# Patient Record
Sex: Female | Born: 1985 | Race: White | Hispanic: No | Marital: Married | Smoking: Never smoker
Health system: Southern US, Community
[De-identification: ages and names within clinical notes are randomized; demographics above are authoritative.]

## PROBLEM LIST (undated history)

## (undated) DIAGNOSIS — F419 Anxiety disorder, unspecified: Secondary | ICD-10-CM

## (undated) HISTORY — DX: Anxiety disorder, unspecified: F41.9

---

## 2009-01-06 ENCOUNTER — Emergency Department (HOSPITAL_COMMUNITY): Admission: EM | Admit: 2009-01-06 | Discharge: 2009-01-06 | Payer: Self-pay | Admitting: Emergency Medicine

## 2009-06-02 ENCOUNTER — Emergency Department (HOSPITAL_COMMUNITY): Admission: EM | Admit: 2009-06-02 | Discharge: 2009-06-02 | Payer: Self-pay | Admitting: Emergency Medicine

## 2010-07-07 ENCOUNTER — Encounter
Admission: RE | Admit: 2010-07-07 | Discharge: 2010-07-07 | Payer: Self-pay | Source: Home / Self Care | Attending: Family Medicine | Admitting: Family Medicine

## 2010-09-27 LAB — URINALYSIS, ROUTINE W REFLEX MICROSCOPIC
Bilirubin Urine: NEGATIVE
Nitrite: NEGATIVE
Specific Gravity, Urine: 1.005 (ref 1.005–1.030)
pH: 6 (ref 5.0–8.0)

## 2010-09-27 LAB — POCT I-STAT, CHEM 8
Creatinine, Ser: 0.7 mg/dL (ref 0.4–1.2)
HCT: 46 % (ref 36.0–46.0)
Hemoglobin: 15.6 g/dL — ABNORMAL HIGH (ref 12.0–15.0)
Potassium: 3.3 mEq/L — ABNORMAL LOW (ref 3.5–5.1)
Sodium: 137 mEq/L (ref 135–145)

## 2010-12-10 ENCOUNTER — Emergency Department (HOSPITAL_COMMUNITY)
Admission: EM | Admit: 2010-12-10 | Discharge: 2010-12-11 | Disposition: A | Discharge status: Home / Self Care | Payer: BC Managed Care – PPO | Attending: Emergency Medicine | Admitting: Emergency Medicine

## 2010-12-10 ENCOUNTER — Emergency Department (HOSPITAL_COMMUNITY): Payer: BC Managed Care – PPO

## 2010-12-10 DIAGNOSIS — Z0000 Encounter for general adult medical examination without abnormal findings: Secondary | ICD-10-CM | POA: Insufficient documentation

## 2010-12-10 DIAGNOSIS — R209 Unspecified disturbances of skin sensation: Secondary | ICD-10-CM | POA: Insufficient documentation

## 2010-12-10 DIAGNOSIS — R42 Dizziness and giddiness: Secondary | ICD-10-CM | POA: Insufficient documentation

## 2010-12-10 LAB — DIFFERENTIAL
Basophils Absolute: 0 10*3/uL (ref 0.0–0.1)
Lymphocytes Relative: 41 % (ref 12–46)
Neutro Abs: 2.9 10*3/uL (ref 1.7–7.7)
Neutrophils Relative %: 52 % (ref 43–77)

## 2010-12-10 LAB — CBC
HCT: 39.1 % (ref 36.0–46.0)
Hemoglobin: 13.3 g/dL (ref 12.0–15.0)
WBC: 5.7 10*3/uL (ref 4.0–10.5)

## 2010-12-10 LAB — POCT I-STAT, CHEM 8
Chloride: 106 mEq/L (ref 96–112)
HCT: 42 % (ref 36.0–46.0)
Potassium: 4.3 mEq/L (ref 3.5–5.1)

## 2010-12-10 LAB — POCT PREGNANCY, URINE: Preg Test, Ur: NEGATIVE

## 2010-12-11 ENCOUNTER — Encounter (HOSPITAL_COMMUNITY): Payer: Self-pay

## 2011-07-13 ENCOUNTER — Encounter (INDEPENDENT_AMBULATORY_CARE_PROVIDER_SITE_OTHER): Payer: BC Managed Care – PPO

## 2011-07-13 DIAGNOSIS — R079 Chest pain, unspecified: Secondary | ICD-10-CM

## 2011-07-13 DIAGNOSIS — K250 Acute gastric ulcer with hemorrhage: Secondary | ICD-10-CM

## 2011-07-13 DIAGNOSIS — R002 Palpitations: Secondary | ICD-10-CM

## 2012-03-17 ENCOUNTER — Encounter: Payer: Self-pay | Admitting: Family Medicine

## 2012-03-17 ENCOUNTER — Ambulatory Visit (INDEPENDENT_AMBULATORY_CARE_PROVIDER_SITE_OTHER): Payer: BC Managed Care – PPO | Admitting: Family Medicine

## 2012-03-17 VITALS — BP 98/58 | HR 69 | Temp 98.6°F | Resp 16 | Ht 62.0 in | Wt 97.6 lb

## 2012-03-17 DIAGNOSIS — M79609 Pain in unspecified limb: Secondary | ICD-10-CM

## 2012-03-17 DIAGNOSIS — M79673 Pain in unspecified foot: Secondary | ICD-10-CM

## 2012-03-17 DIAGNOSIS — D239 Other benign neoplasm of skin, unspecified: Secondary | ICD-10-CM

## 2012-03-17 DIAGNOSIS — D229 Melanocytic nevi, unspecified: Secondary | ICD-10-CM

## 2012-03-17 NOTE — Patient Instructions (Signed)
Arnica gel- you can apply this to your foot around the painful joint 2-3 times a day.

## 2012-03-19 ENCOUNTER — Encounter: Payer: Self-pay | Admitting: Family Medicine

## 2012-03-19 DIAGNOSIS — Z87898 Personal history of other specified conditions: Secondary | ICD-10-CM | POA: Insufficient documentation

## 2012-03-19 DIAGNOSIS — Z8669 Personal history of other diseases of the nervous system and sense organs: Secondary | ICD-10-CM | POA: Insufficient documentation

## 2012-03-19 DIAGNOSIS — Z0000 Encounter for general adult medical examination without abnormal findings: Secondary | ICD-10-CM | POA: Insufficient documentation

## 2012-03-19 DIAGNOSIS — Z8659 Personal history of other mental and behavioral disorders: Secondary | ICD-10-CM | POA: Insufficient documentation

## 2012-03-19 NOTE — Progress Notes (Signed)
S: This 26 y.o. Cauc female c/o R foot/ arch and big toe pain which has been chronic and intermittent. No hx of trauma to back or legs. Wears flats almost daily. Job involves mostly sitting. She also has a skin lesion on back which is of concern; friend advised that she have it checked.  ROS: noncontributory  O:  Filed Vitals:   03/17/12 1118  BP: 98/58  Pulse: 69  Temp: 98.6 F (37 C)  Resp: 16   GEN: In NAD;WD,WD. BACK: Spine straight w/ deformity or muscle spasms. Tender in right SI notch. Forward flexion to 90 degrees w/ difficulty. EXT: FROM w/o deformities; good muscle tone; R foot- tender distal arch and 1st metatarsal. Medial aspect of ball of foot tender. SKIN: <1 cm slightly raised flesh-colored lesion on lower back with color change at left margin. NEURO: A&O x 3; CNs intact. Motor/ sensory grossly normal.  A/P: 1. Foot pain  Ambulatory referral to Podiatry Advised wearing more supportive foot wear (not flats everyday)  2. Mole of skin  Ambulatory referral to Dermatology

## 2012-04-21 ENCOUNTER — Other Ambulatory Visit: Payer: Self-pay | Admitting: Family Medicine

## 2012-04-21 DIAGNOSIS — G8929 Other chronic pain: Secondary | ICD-10-CM | POA: Insufficient documentation

## 2012-08-08 ENCOUNTER — Encounter: Payer: Self-pay | Admitting: Internal Medicine

## 2014-02-05 ENCOUNTER — Ambulatory Visit (INDEPENDENT_AMBULATORY_CARE_PROVIDER_SITE_OTHER): Payer: BC Managed Care – PPO | Admitting: Family Medicine

## 2014-02-05 ENCOUNTER — Ambulatory Visit (INDEPENDENT_AMBULATORY_CARE_PROVIDER_SITE_OTHER): Payer: BC Managed Care – PPO

## 2014-02-05 VITALS — BP 102/64 | HR 60 | Temp 97.6°F | Resp 18 | Ht 62.5 in | Wt 100.0 lb

## 2014-02-05 DIAGNOSIS — S161XXA Strain of muscle, fascia and tendon at neck level, initial encounter: Secondary | ICD-10-CM

## 2014-02-05 DIAGNOSIS — S139XXA Sprain of joints and ligaments of unspecified parts of neck, initial encounter: Secondary | ICD-10-CM
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
## 2015-02-13 ENCOUNTER — Emergency Department
Admission: EM | Admit: 2015-02-13 | Discharge: 2015-02-13 | Disposition: A | Discharge status: Home / Self Care | Payer: BLUE CROSS/BLUE SHIELD | Attending: Emergency Medicine | Admitting: Emergency Medicine

## 2015-02-13 ENCOUNTER — Encounter: Payer: Self-pay | Admitting: Emergency Medicine

## 2015-02-13 DIAGNOSIS — E860 Dehydration: Secondary | ICD-10-CM | POA: Insufficient documentation

## 2015-02-13 DIAGNOSIS — R51 Headache: Secondary | ICD-10-CM | POA: Diagnosis not present

## 2015-02-13 DIAGNOSIS — Z3202 Encounter for pregnancy test, result negative: Secondary | ICD-10-CM | POA: Insufficient documentation

## 2015-02-13 DIAGNOSIS — R42 Dizziness and giddiness: Secondary | ICD-10-CM

## 2015-02-13 LAB — URINALYSIS COMPLETE WITH MICROSCOPIC (ARMC ONLY)
Bacteria, UA: NONE SEEN
Bilirubin Urine: NEGATIVE
Glucose, UA: NEGATIVE mg/dL
Hgb urine dipstick: NEGATIVE
KETONES UR: NEGATIVE mg/dL
Leukocytes, UA: NEGATIVE
Nitrite: NEGATIVE
PROTEIN: NEGATIVE mg/dL
Specific Gravity, Urine: 1.004 — ABNORMAL LOW (ref 1.005–1.030)
WBC, UA: NONE SEEN WBC/hpf (ref 0–5)
pH: 6 (ref 5.0–8.0)

## 2015-02-13 LAB — PREGNANCY, URINE: PREG TEST UR: NEGATIVE

## 2015-02-13 MED ORDER — SODIUM CHLORIDE 0.9 % IV BOLUS (SEPSIS)
1000.0000 mL | Freq: Once | INTRAVENOUS | Status: AC
  Administered 2015-02-13: 1000 mL via INTRAVENOUS

## 2015-02-13 NOTE — Discharge Instructions (Signed)
Dehydration in Sports The body uses the kidney, bladder, and thirst mechanisms in an intricate system to maintain the proper fluid levels in the body. When this system is stressed, such as during exercise, the system may not be able to maintain these levels. This results in the body lacking water (dehydration). Dehydration can be a problem because the body requires a certain amount of water and other fluids to maintain its blood volume. Fluid is lost when you urinate, sweat, breathe, vomit, or have diarrhea. Dehydration occurs when you drink less fluid than you lose. Dehydration may occur even before you become thirsty. It is important for athletes to keep drinking during activity, even if they do not feel thirsty. SYMPTOMS   Thirst.  Dry mouth.  Tiredness (lethargy).  Dark urine.  Headache.  Muscle cramps.  Rapid breathing.  Lightheadedness, especially when you stand from a sitting position.  Dry, warm skin.  Little or no urination.  Low blood pressure.  Fainting (syncope).  Delirium or unconsciousness. RISK INCREASES WITH:  Diarrhea.  Vomiting.  Inadequate fluid intake during an illness or strenuous exercise.  Inadequate food intake during an illness, during strenuous exercise, or after strenuous exercise.  Use of diuretic medicines, which control excess body fluid by causing fluid loss.  Certain age groups. Infants and the elderly are at greater risk. PREVENTION  Drink frequently throughout physical activity even if you do not feel thirsty. Drink small amounts of fluid frequently throughout and after sporting events.  Drink extra fluids to keep up with any ongoing losses (sweating, diarrhea).  Carry extra water and the ingredients for making an oral rehydration solution (ORS).  If you have diarrhea or vomiting or you are not drinking much, force yourself to drink more liquids before you become dehydrated. RELATED COMPLICATIONS   Reduced ability to dissipate  heat, resulting in elevated core body temperatures.  Heat illness.  Heat stroke.  Kidney failure. TREATMENT Mild dehydration is treated by drinking enough fluid to replace the fluids you have lost. You may also need to replace the electrolytes you have lost. Recommendations for replenishing fluids and electrolytes include drinking sips of water slowly, eating foods with salt, drinking sports drinks, or taking over-the-counter dehydration medicines. Treat dehydration immediately. Do not wait until dehydration becomes severe.  Packets of ORS are widely available. Follow the directions on the packet. If no instructions are given, mix the contents of the ORS with 1 quart or liter of drinking water. If you are not sure if the water is safe to drink, first boil the water for at least 5 minutes.  If you are unable to obtain ORS you may create your own by adding 2 tbs of sugar or honey,  tsp salt, and  tsp baking soda to 1 quart or liter of water. If you do not have any baking soda, add another  tsp of salt. If possible, add  cup orange juice or some mashed banana to improve the taste and provide some potassium. Drink sips of the ORS every 5 minutes until urination becomes normal. It is normal to urinate 4 or 5 times a day. Adults and adolescents should drink at least 3 quarts or liters of ORS a day until they are well. For individuals who are vomiting or have diarrhea, it is important to keep trying to drink the ORS. Your body may retain some of the fluids and salts you need even if you are vomiting or have diarrhea. Remember to take only sips of liquids. Chilling  the ORS may help. Severe dehydration is a medical emergency. If you have symptoms of severe dehydration, seek medical care immediately. It may be necessary for you to receive intravenous (IV) fluids. If you are able to drink, you should also drink the ORS. With treatment for dehydration, whatever is causing diarrhea, vomiting, or other symptoms  should also be treated.  Document Released: 06/07/2005 Document Revised: 08/30/2011 Document Reviewed: 09/19/2008 [facility] Patient Information 2015 [place], [place]. This information is not intended to replace advice given to you by your health care provider. Make sure you discuss any questions you have with your health care provider.  Dizziness Dizziness is a common problem. It is a feeling of unsteadiness or light-headedness. You may feel like you are about to faint. Dizziness can lead to injury if you stumble or fall. A person of any age group can suffer from dizziness, but dizziness is more common in older adults. CAUSES  Dizziness can be caused by many different things, including:  Middle ear problems.  Standing for too long.  Infections.  An allergic reaction.  Aging.  An emotional response to something, such as the sight of blood.  Side effects of medicines.  Tiredness.  Problems with circulation or blood pressure.  Excessive use of alcohol or medicines, or illegal drug use.  Breathing too fast (hyperventilation).  An irregular heart rhythm (arrhythmia).  A low red blood cell count (anemia).  Pregnancy.  Vomiting, diarrhea, fever, or other illnesses that cause body fluid loss (dehydration).  Diseases or conditions such as Parkinson's disease, high blood pressure (hypertension), diabetes, and thyroid problems.  Exposure to extreme heat. DIAGNOSIS  Your health care provider will ask about your symptoms, perform a physical exam, and perform an electrocardiogram (ECG) to record the electrical activity of your heart. Your health care provider may also perform other heart or blood tests to determine the cause of your dizziness. These may include:  Transthoracic echocardiogram (TTE). During echocardiography, sound waves are used to evaluate how blood flows through your heart.  Transesophageal echocardiogram (TEE).  Cardiac monitoring. This allows your health care  provider to monitor your heart rate and rhythm in real time.  Holter monitor. This is a portable device that records your heartbeat and can help diagnose heart arrhythmias. It allows your health care provider to track your heart activity for several days if needed.  Stress tests by exercise or by giving medicine that makes the heart beat faster. TREATMENT  Treatment of dizziness depends on the cause of your symptoms and can vary greatly. HOME CARE INSTRUCTIONS   Drink enough fluids to keep your urine clear or pale yellow. This is especially important in very hot weather. In older adults, it is also important in cold weather.  Take your medicine exactly as directed if your dizziness is caused by medicines. When taking blood pressure medicines, it is especially important to get up slowly.  Rise slowly from chairs and steady yourself until you feel okay.  In the morning, first sit up on the side of the bed. When you feel okay, stand slowly while holding onto something until you know your balance is fine.  Move your legs often if you need to stand in one place for a long time. Tighten and relax your muscles in your legs while standing.  Have someone stay with you for 1-2 days if dizziness continues to be a problem. Do this until you feel you are well enough to stay alone. Have the person call your health care  provider if he or she notices changes in you that are concerning.  Do not drive or use heavy machinery if you feel dizzy.  Do not drink alcohol. SEEK IMMEDIATE MEDICAL CARE IF:   Your dizziness or light-headedness gets worse.  You feel nauseous or vomit.  You have problems talking, walking, or using your arms, hands, or legs.  You feel weak.  You are not thinking clearly or you have trouble forming sentences. It may take a friend or family member to notice this.  You have chest pain, abdominal pain, shortness of breath, or sweating.  Your vision changes.  You notice any  bleeding.  You have side effects from medicine that seems to be getting worse rather than better. MAKE SURE YOU:   Understand these instructions.  Will watch your condition.  Will get help right away if you are not doing well or get worse. Document Released: 12/01/2000 Document Revised: 06/12/2013 Document Reviewed: 12/25/2010 [facility] Patient Information 2015 [place], [place]. This information is not intended to replace advice given to you by your health care provider. Make sure you discuss any questions you have with your health care provider.

## 2015-02-13 NOTE — ED Provider Notes (Signed)
[facility] Emergency Department Provider Note  ____________________________________________  Time seen: 12:55 PM  I have reviewed the triage vital signs and the nursing notes.   HISTORY  Chief Complaint Dehydration    HPI Jaclyn Lucero is a 29 y.o. female who complains of headache and dizziness for the past few days which occurs intermittently. 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 She also is taking diuretics for weight loss. The headache and dizziness appear to occur intermittently but most often after stopping exercise or when calming down after arguing with her husband. She denies any chest pain shortness of breath patient changes syncope or injuries. Does not occur when she turns her head. No numbness tingling or weakness.     Past Medical History  Diagnosis Date  . Anxiety     Patient Active Problem List   Diagnosis Date Noted  . Pain, foot, right, chronic 04/21/2012  . History of anxiety 03/19/2012  . Health care maintenance 03/19/2012  . History of abnormal Pap smear 03/19/2012  . History of migraine headaches 03/19/2012    History reviewed. No pertinent past surgical history.  No current outpatient prescriptions on file.  Allergies Review of patient's allergies indicates no known allergies.  Family History  Problem Relation Age of Onset  . Hypertension Maternal Grandmother     Social History Social History  Substance Use Topics  . Smoking status: Never Smoker   . Smokeless tobacco: None  . 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 No weight changes Eyes:No blurry vision or double vision.  ENT: No sore throat. Cardiovascular: No chest pain. Respiratory: No dyspnea or cough. Gastrointestinal: Negative for abdominal pain, vomiting and diarrhea.  No  BRBPR or melena. Genitourinary: Negative for dysuria, urinary retention, bloody urine, or difficulty urinating. Musculoskeletal: Negative for back pain. No joint swelling or pain. Skin: Negative for rash. Neurological: Intermittent headache with dizziness  Psychiatric:No anxiety or depression.   Endocrine:No hot/cold intolerance, changes in energy, or sleep difficulty.  10-point ROS otherwise negative.  ____________________________________________   PHYSICAL EXAM:  VITAL SIGNS: ED Triage Vitals  Enc Vitals Group     BP 02/13/15 1240 113/77 mmHg     Pulse Rate 02/13/15 1240 68     Resp 02/13/15 1240 14     Temp 02/13/15 1240 98 F (36.7 C)     Temp Source 02/13/15 1240 Oral     SpO2 02/13/15 1240 100 %     Weight 02/13/15 1240 100 lb (45.36 kg)     Height 02/13/15 1240 5\' 2"  (1.575 m)     Head Cir --      Peak Flow --      Pain Score 02/13/15 1237 2     Pain Loc --      Pain Edu? --      Excl. in [place]? --      Constitutional: Alert and oriented. Well appearing and in no distress. Eyes: No scleral icterus. No conjunctival pallor. PERRL. EOMI ENT   Head: Normocephalic and atraumatic.   Nose: No congestion/rhinnorhea. No septal hematoma   Mouth/Throat: MMM, no pharyngeal erythema. No peritonsillar mass. No uvula shift.   Neck: No stridor. No SubQ emphysema. No meningismus. Hematological/Lymphatic/Immunilogical: No cervical lymphadenopathy. Cardiovascular: RRR. Normal and symmetric distal pulses are present in all extremities. No murmurs,  rubs, or gallops. Respiratory: Normal respiratory effort without tachypnea nor retractions. Breath sounds are clear and equal bilaterally. No wheezes/rales/rhonchi. Gastrointestinal: Soft and nontender. No distention. There is no CVA tenderness.  No rebound, rigidity, or guarding. Genitourinary: deferred Musculoskeletal: Nontender with normal range of motion in all extremities. No joint effusions.  No lower extremity  tenderness.  No edema. Neurologic:   Normal speech and language.  CN 2-10 normal. Motor grossly intact. No pronator drift.  Normal gait. No gross focal neurologic deficits are appreciated.  Skin:  Skin is warm, dry and intact. No rash noted.  No petechiae, purpura, or bullae. Psychiatric: Mood and affect are normal. Speech and behavior are normal. Patient exhibits appropriate insight and judgment.  ____________________________________________    LABS (pertinent positives/negatives) (all labs ordered are listed, but only abnormal results are displayed) Labs Reviewed  URINALYSIS COMPLETEWITH MICROSCOPIC ([place]) - Abnormal; Notable for the following:    Color, Urine STRAW (*)    APPearance CLEAR (*)    Specific Gravity, Urine 1.004 (*)    Squamous Epithelial / LPF 0-5 (*)    All other components within normal limits  PREGNANCY, URINE   ____________________________________________   EKG    ____________________________________________    RADIOLOGY    ____________________________________________   PROCEDURES  ____________________________________________   INITIAL IMPRESSION / ASSESSMENT AND PLAN / ED COURSE  Pertinent labs & imaging results that were available during my care of the patient were reviewed by me and considered in my medical decision making (see chart for details).  History is strongly consistent with dehydration, due to combination of warm weather, strenuous exercise and weight lifting, and inappropriate use of diuretics. She is very well-appearing, no acute distress, smiling and calm and comfortable. We will give her fluids and check a pregnancy test and plan to discharge her home. I encouraged her to discontinue the diuretic use whenever possible and to prioritize hydration. ----------------------------------------- 2:14 PM on 02/13/2015 -----------------------------------------  Urinalysis and pregnancy test negative. Patient still feels a  little bit lightheaded, but have low suspicion for any intracranial or cardiovascular pathology at this time. We'll discharge the patient home and have her follow up with primary care. She was able to eat in the ED. ____________________________________________   FINAL CLINICAL IMPRESSION(S) / ED DIAGNOSES  Final diagnoses:  Dizziness  Dehydration, mild      Carrie Mew, MD 02/13/15 1414

## 2015-02-13 NOTE — ED Notes (Signed)
Sent in from [place] for possible dehydration   Headache and some dizziness   Denies any n/v

## 2015-02-14 LAB — POCT PREGNANCY, URINE: Preg Test, Ur: NEGATIVE

## 2015-10-16 ENCOUNTER — Ambulatory Visit (INDEPENDENT_AMBULATORY_CARE_PROVIDER_SITE_OTHER): Payer: BLUE CROSS/BLUE SHIELD | Admitting: Family Medicine

## 2015-10-16 VITALS — BP 104/68 | HR 64 | Temp 98.5°F | Resp 18 | Wt 102.0 lb

## 2015-10-16 DIAGNOSIS — N912 Amenorrhea, unspecified: Secondary | ICD-10-CM

## 2015-10-16 DIAGNOSIS — H9202 Otalgia, left ear: Secondary | ICD-10-CM

## 2015-10-16 DIAGNOSIS — R590 Localized enlarged lymph nodes: Secondary | ICD-10-CM

## 2015-10-16 DIAGNOSIS — K047 Periapical abscess without sinus: Secondary | ICD-10-CM

## 2015-10-16 LAB — POCT CBC
GRANULOCYTE PERCENT: 54.9 % (ref 37–80)
HEMATOCRIT: 37.4 % — AB (ref 37.7–47.9)
Hemoglobin: 13.3 g/dL (ref 12.2–16.2)
LYMPH, POC: 1.9 (ref 0.6–3.4)
MCH, POC: 30.7 pg (ref 27–31.2)
MCHC: 35.5 g/dL — AB (ref 31.8–35.4)
MCV: 86.3 fL (ref 80–97)
MID (cbc): 0.2 (ref 0–0.9)
MPV: 7.7 fL (ref 0–99.8)
POC GRANULOCYTE: 2.6 (ref 2–6.9)
POC LYMPH %: 40.9 % (ref 10–50)
POC MID %: 4.2 % (ref 0–12)
Platelet Count, POC: 294 10*3/uL (ref 142–424)
RBC: 4.33 M/uL (ref 4.04–5.48)
RDW, POC: 15.1 %
WBC: 4.7 10*3/uL (ref 4.6–10.2)

## 2015-10-16 LAB — POCT URINE PREGNANCY: Preg Test, Ur: NEGATIVE

## 2015-10-16 NOTE — Patient Instructions (Addendum)
Pregnancy test is negative  Continue taking the penicillin  Increase the ibuprofen to 400 mg 4 times daily(maximum in 24 hours should be 2400 mg)  You can safely take some Tylenol (acetaminophen) 1000 mg 2 or 3 times daily if needed in addition to the ibuprofen.  Return if needed      IF you received an x-ray today, you will receive an invoice from [facility]. Please contact [facility] at [phone] with questions or concerns regarding your invoice.   IF you received labwork today, you will receive an invoice from [employer]. Please contact [facility] at [phone] with questions or concerns regarding your invoice.   Our billing staff will not be able to assist you with questions regarding bills from these companies.  You will be contacted with the lab results as soon as they are available. The fastest way to get your results is to activate your My Chart account. Instructions are located on the last page of this paperwork. If you have not heard from [place] regarding the results in 2 weeks, please contact this office.

## 2015-10-16 NOTE — Progress Notes (Signed)
Patient ID: Jaclyn Lucero, female    DOB: 29-Dec-1985  Age: 30 y.o. MRN: [id number]  Chief Complaint  Patient presents with  . pressure behind left ear  . Dental Pain    Subjective:   30 year old lady who has been having dental pain for for 5 days. She hurts in her right lower gum margin and is been on penicillin for 4 days 500 mg 4 times a day. She is scheduled with her dentist to have a root canal done. Yesterday she was in a meeting and chewing gum and starting of pain in her left jaw just below the ear and behind the ear. She did feel some swelling there. It has persisted. Has not had this problem with gum chewing in the past and has a history of regularly chewing. Her last menstrual cycle was about 6 or 7 weeks ago, when she had just been started back on birth control pills. She is married and does not plan to have children at this time. She has done about 5 (test which were negative.  Current allergies, medications, problem list, past/family and social histories reviewed.  Objective:  BP 104/68 mmHg  Pulse 64  Temp(Src) 98.5 F (36.9 C)  Resp 18  Wt 102 lb (46.267 kg)  SpO2 99%  LMP 08/28/2015  Pleasant young lady in no acute distress. TMs normal. Throat not erythematous. Gums are not erythematous at this time. Her neck has a small right cervical node and also a small node) angle of the jaw on the left. Only minimally tender at this time. Jaw joint itself does not give pain and opening and closing of the jaw does not give pain.  Assessment & Plan:   Assessment: 1. Cervical adenopathy   2. Dental abscess   3. Otalgia of left ear   4. Amenorrhea       Plan: Check a CBC, also a pregnancy test to be on the safe side. Results for orders placed or performed in visit on 10/16/15  POCT CBC  Result Value Ref Range   WBC 4.7 4.6 - 10.2 K/uL   Lymph, poc 1.9 0.6 - 3.4   POC LYMPH PERCENT 40.9 10 - 50 %L   MID (cbc) 0.2 0 - 0.9   POC MID % 4.2 0 - 12 %M   POC Granulocyte 2.6 2  - 6.9   Granulocyte percent 54.9 37 - 80 %G   RBC 4.33 4.04 - 5.48 M/uL   Hemoglobin 13.3 12.2 - 16.2 g/dL   HCT, POC 37.4 (A) 37.7 - 47.9 %   MCV 86.3 80 - 97 fL   MCH, POC 30.7 27 - 31.2 pg   MCHC 35.5 (A) 31.8 - 35.4 g/dL   RDW, POC 15.1 %   Platelet Count, POC 294 142 - 424 K/uL   MPV 7.7 0 - 99.8 fL  POCT urine pregnancy  Result Value Ref Range   Preg Test, Ur Negative Negative    Orders Placed This Encounter  Procedures  . POCT CBC  . POCT urine pregnancy    Meds ordered this encounter  Medications  . penicillin v potassium (VEETID) 500 MG tablet    Sig: Take 500 mg by mouth 4 (four) times daily.  . norethindrone-ethinyl estradiol-iron (MICROGESTIN FE,GILDESS FE,LOESTRIN FE) 1.5-30 MG-MCG tablet    Sig: Take 1 tablet by mouth daily.  Marland Kitchen ibuprofen (ADVIL,MOTRIN) 200 MG tablet    Sig: Take 200 mg by mouth every 6 (six) hours as needed.  Patient Instructions   Pregnancy test is negative  Continue taking the penicillin  Increase the ibuprofen to 400 mg 4 times daily(maximum in 24 hours should be 2400 mg)  You can safely take some Tylenol (acetaminophen) 1000 mg 2 or 3 times daily if needed in addition to the ibuprofen.  Return if needed      IF you received an x-ray today, you will receive an invoice from [facility]. Please contact [facility] at [phone] with questions or concerns regarding your invoice.   IF you received labwork today, you will receive an invoice from [employer]. Please contact [facility] at [phone] with questions or concerns regarding your invoice.   Our billing staff will not be able to assist you with questions regarding bills from these companies.  You will be contacted with the lab results as soon as they are available. The fastest way to get your results is to activate your My Chart account. Instructions are located on the last page of this paperwork. If you have not heard  from [place] regarding the results in 2 weeks, please contact this office.         Return if symptoms worsen or fail to improve.   Rafay Dahan, MD 10/16/2015

## 2016-02-06 ENCOUNTER — Ambulatory Visit (INDEPENDENT_AMBULATORY_CARE_PROVIDER_SITE_OTHER): Payer: BLUE CROSS/BLUE SHIELD | Admitting: Physician Assistant

## 2016-02-06 ENCOUNTER — Ambulatory Visit: Payer: BLUE CROSS/BLUE SHIELD | Admitting: Cardiovascular Disease

## 2016-02-06 VITALS — BP 104/66 | HR 72 | Temp 98.1°F | Resp 18 | Ht 62.0 in | Wt 99.2 lb

## 2016-02-06 DIAGNOSIS — IMO0002 Reserved for concepts with insufficient information to code with codable children: Secondary | ICD-10-CM

## 2016-02-06 DIAGNOSIS — Z23 Encounter for immunization: Secondary | ICD-10-CM | POA: Diagnosis not present

## 2016-02-06 DIAGNOSIS — R079 Chest pain, unspecified: Secondary | ICD-10-CM | POA: Diagnosis not present

## 2016-02-06 DIAGNOSIS — Z7189 Other specified counseling: Secondary | ICD-10-CM

## 2016-02-06 LAB — COMPLETE METABOLIC PANEL WITH GFR
ALT: 14 U/L (ref 6–29)
AST: 23 U/L (ref 10–30)
Albumin: 4.2 g/dL (ref 3.6–5.1)
Alkaline Phosphatase: 36 U/L (ref 33–115)
BILIRUBIN TOTAL: 0.8 mg/dL (ref 0.2–1.2)
BUN: 16 mg/dL (ref 7–25)
CO2: 25 mmol/L (ref 20–31)
Calcium: 9.4 mg/dL (ref 8.6–10.2)
Chloride: 104 mmol/L (ref 98–110)
Creat: 0.76 mg/dL (ref 0.50–1.10)
GFR, Est African American: >89 mL/min (ref >=60)
GLUCOSE: 71 mg/dL (ref 65–99)
POTASSIUM: 4.7 mmol/L (ref 3.5–5.3)
SODIUM: 137 mmol/L (ref 135–146)
TOTAL PROTEIN: 7 g/dL (ref 6.1–8.1)

## 2016-02-06 LAB — POCT CBC
Granulocyte percent: 74.1 %G (ref 37–80)
HEMATOCRIT: 40.5 % (ref 37.7–47.9)
HEMOGLOBIN: 14.1 g/dL (ref 12.2–16.2)
LYMPH, POC: 1.8 (ref 0.6–3.4)
MCH, POC: 31.2 pg (ref 27–31.2)
MCHC: 34.8 g/dL (ref 31.8–35.4)
MCV: 89.7 fL (ref 80–97)
MID (cbc): 0.3 (ref 0–0.9)
MPV: 7.6 fL (ref 0–99.8)
PLATELET COUNT, POC: 299 10*3/uL (ref 142–424)
POC GRANULOCYTE: 6 (ref 2–6.9)
POC LYMPH PERCENT: 21.7 %L (ref 10–50)
POC MID %: 4.2 %M (ref 0–12)
RBC: 4.51 M/uL (ref 4.04–5.48)
RDW, POC: 14.5 %
WBC: 8.1 10*3/uL (ref 4.6–10.2)

## 2016-02-06 LAB — TSH: TSH: 0.88 mIU/L

## 2016-02-06 MED ORDER — TYPHOID VACCINE PO CPDR: 1.0000 | DELAYED_RELEASE_CAPSULE | ORAL | 0 refills | Status: DC

## 2016-02-06 MED ORDER — DOXYCYCLINE HYCLATE 100 MG PO CAPS: ORAL_CAPSULE | ORAL | 0 refills | Status: DC

## 2016-02-06 NOTE — Progress Notes (Signed)
Patient ID: Jaclyn Lucero, female   DOB: 09-23-1985, 30 y.o.   MRN: [id number] [facility] [address] [place] [address] [phone]  Date:  02/06/2016   Name:  Jaclyn Lucero   DOB:  [birth year]   MRN:  [id number]  PCP:  No PCP Per Patient   By signing my name below, I, Essence Howell, attest that this documentation has been prepared under the direction and in the presence of Ivar Drape, PA-C Electronically Signed: Ladene Artist, ED Scribe 02/06/2016 at 11:53 AM.  History of Present Illness: Chief Complaint  Patient presents with   Chest Pain    PAIN IN LEFT ARM   Fatigue    Jaclyn Lucero is a 30 y.o. female patient who presents to [facility] complaining of intermittent left sided chest pain underneath her breast for several months. Pt reports chest pain at rest or with exertion. Chest pain is not palpable but constant, day and night. Pt reports associated symptoms of tingling at the left arm. Pain does not increase with the addition of L arm tingling. She reports associated SOB and fatigue with workouts; pt is a Production assistant, radio. She also reports associated nausea and 1 diaphoretic episode. Pt denies leg swelling. She reports similar episodes of this prior. No previous consultations with cardiology. No h/o anemia or irregular menses secondary to start of a new birth control. No h/o thyroid disease but maternal grandmother has a h/o hypothyroidism. No cardiac death before age of 72. Pt is also here for immunizations. She is traveling to [place] in 1 month. She requests Hep A, typhoid, malaria vaccines at this visit. Pt is currently taking penicillin for an abscess; she finishes the dose in 2 days.   Patient Active Problem List   Diagnosis Date Noted   Pain, foot, right, chronic 04/21/2012   History of anxiety 03/19/2012   Health care maintenance 03/19/2012   History of abnormal Pap smear 03/19/2012   History of migraine headaches 03/19/2012     Past Medical History:  Diagnosis Date   Anxiety     History reviewed. No pertinent surgical history.  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  Current Outpatient Prescriptions on File Prior to Visit  Medication Sig Dispense Refill   norethindrone-ethinyl estradiol-iron (MICROGESTIN FE,GILDESS FE,LOESTRIN FE) 1.5-30 MG-MCG tablet Take 1 tablet by mouth daily.     penicillin v potassium (VEETID) 500 MG tablet Take 500 mg by mouth 4 (four) times daily.     ibuprofen (ADVIL,MOTRIN) 200 MG tablet Take 200 mg by mouth every 6 (six) hours as needed.     No current facility-administered medications on file prior to visit.     Review of Systems  Constitutional: Positive for diaphoresis (1 episode) and malaise/fatigue.  Respiratory: Positive for shortness of breath.   Cardiovascular: Positive for chest pain. Negative for leg swelling.  Gastrointestinal: Positive for nausea.  Neurological: Positive for tingling.   Physical Examination: BP 104/66 (BP Location: Right Arm, Patient Position: Sitting, Cuff Size: Small)    Pulse 72    Temp 98.1 F (36.7 C) (Oral)    Resp 18    Ht 5\' 2"  (1.575 m)  Wt 99 lb 3.2 oz (45 kg)    SpO2 100%    BMI 18.14 kg/m  Ideal Body Weight: @FLOWAMB [id number]  Physical Exam  Constitutional: She is oriented to person, place, and time. She appears well-developed and well-nourished. No distress.  HENT:  Head: Normocephalic and atraumatic.  Right Ear: Tympanic membrane, external ear and ear canal normal.  Left Ear: Tympanic membrane, external ear and ear canal normal.  Eyes: Conjunctivae and EOM are normal. Pupils are equal, round, and reactive to light.   Cardiovascular: Normal rate, normal heart sounds and normal pulses.  An irregular rhythm present. Exam reveals no gallop and no friction rub.   No murmur heard. Pulses:      Dorsalis pedis pulses are 2+ on the right side, and 2+ on the left side.  Pulmonary/Chest: Effort normal and breath sounds normal. No respiratory distress. She has no wheezes. She has no rhonchi. She has no rales.  Neurological: She is alert and oriented to person, place, and time.  Skin: She is not diaphoretic.  Psychiatric: She has a normal mood and affect. Her behavior is normal.   Assessment and Plan: Jaclyn Lucero is a 30 y.o. female who is here today for cc of fatigue and immunizations. Immunizations given today upon review of CDC for [place].  Advised medication administration for typhoid. Chest pain and fatigue are concerning.  With irregular and slightly abnormal ekg, consult cardiology is appreciated at this time.   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  She voiced understanding. Chest pain, unspecified chest pain type - Plan: EKG 12-Lead, POCT CBC, COMPLETE METABOLIC PANEL WITH GFR, TSH  Need for immunization against typhoid - Plan: DISCONTINUED: typhoid (VIVOTIF) DR capsule  Need for meningococcal vaccination - Plan: Meningococcal conjugate vaccine 4-valent IM  Need for prophylactic vaccination and inoculation against viral hepatitis - Plan: Hepatitis A vaccine adult IM  Advice or immunization for travel - Plan: DISCONTINUED: doxycycline (VIBRAMYCIN) 100 MG capsule   Ivar Drape, PA-C [facility] 02/06/2016 11:53 AM

## 2016-02-06 NOTE — Patient Instructions (Addendum)
Please start the doxycycline 1-2 days prior to your trip, during, and 4 weeks after your return.   IF you received an x-ray today, you will receive an invoice from [facility]. Please contact [facility] at [phone] with questions or concerns regarding your invoice.   IF you received labwork today, you will receive an invoice from [employer]. Please contact [facility] at [phone] with questions or concerns regarding your invoice.   Our billing staff will not be able to assist you with questions regarding bills from these companies.  You will be contacted with the lab results as soon as they are available. The fastest way to get your results is to activate your My Chart account. Instructions are located on the last page of this paperwork. If you have not heard from [place] regarding the results in 2 weeks, please contact this office.

## 2016-02-09 ENCOUNTER — Telehealth: Payer: Self-pay

## 2016-02-09 NOTE — Telephone Encounter (Signed)
Jaclyn Lucero /// states they are receiving her referral and can schedule her if we would just send medical records.   [phone] Please call Sharyn Lull

## 2016-02-09 NOTE — Telephone Encounter (Addendum)
The office notes are not completed from the 02/06/16 OV with Colletta Maryland English.  They will need to be completed before we can send them to the cardiologist.

## 2016-02-11 ENCOUNTER — Ambulatory Visit (INDEPENDENT_AMBULATORY_CARE_PROVIDER_SITE_OTHER): Payer: BLUE CROSS/BLUE SHIELD | Admitting: Cardiovascular Disease

## 2016-02-11 ENCOUNTER — Encounter: Payer: Self-pay | Admitting: Cardiovascular Disease

## 2016-02-11 VITALS — BP 106/72 | HR 76 | Ht 62.0 in | Wt 99.0 lb

## 2016-02-11 DIAGNOSIS — R079 Chest pain, unspecified: Secondary | ICD-10-CM

## 2016-02-11 NOTE — Patient Instructions (Signed)
Medication Instructions:  Your physician recommends that you continue on your current medications as directed. Please refer to the Current Medication list given to you today.   Testing/Procedures: Your physician has requested that you have an echocardiogram. Echocardiography is a painless test that uses sound waves to create images of your heart. It provides your doctor with information about the size and shape of your heart and how well your heart's chambers and valves are working. This procedure takes approximately one hour. There are no restrictions for this procedure. IN THE NEXT WEEK.  Your physician has requested that you have en exercise stress myoview. For further information please visit [email or web address]. Please follow instruction sheet, as given. IN THE NEXT WEEK.   Follow-Up: Your physician recommends that you schedule a follow-up appointment in: 2 [place].   If you need a refill on your cardiac medications before your next appointment, please call your pharmacy.

## 2016-02-11 NOTE — Assessment & Plan Note (Signed)
Jaclyn Lucero is a 30 year old appearing married Caucasian female who was referred today for evaluation of new onset chest pain. 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 She has no cardiac risk factors. Approximately a week and a half ago she noticed substernal chest pain with exertion as well as at rest with radiation to her left upper extremity. She has been awakened from sleep with chest pain. She is also noticed some increasing shortness of breath. She is on birth control pills. I am going to the echo and exercise Myoview to rule out an ischemic etiology. I'll see her back after that for further evaluation.

## 2016-02-11 NOTE — Progress Notes (Signed)
     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 She is referred by her OB/GYN, Dr. Arnetha Gula, for cardiovascular evaluation because of new onset chest pain. She has no cardiac risk factors. She exercises frequently actually teaches exercise classes. She developed chest pain approximately half ago. We will exercise but also has a current arrest and has awakened her from sleep. The pain has ready to left upper extremity and has caused decreased exercise tolerance and increased dyspnea on exertion. It should be noted that she is on birth control pills.   Current Outpatient Prescriptions  Medication Sig Dispense Refill  . aspirin 325 MG tablet Take 325 mg by mouth daily.    . norethindrone-ethinyl estradiol-iron (MICROGESTIN FE,GILDESS FE,LOESTRIN FE) 1.5-30 MG-MCG tablet Take 1 tablet by mouth daily.     No current facility-administered medications for this visit.     No Known Allergies  Social History   Social History  . Marital status: Married    Spouse name: N/A  . Number of children: N/A  . Years of education: N/A   Occupational History  . Not on file.   Social History Main Topics  . Smoking status: Never Smoker  . Smokeless tobacco: Never Used  . Alcohol use Yes     Comment: [employer]QU[employer]  . Drug use: Unknown  . Sexual activity: Not on file   Other Topics Concern  . Not on file   Social History Narrative  . No narrative on file     Review of Systems: General: negative for chills, fever, night sweats or weight changes.  Cardiovascular: negative for chest pain, dyspnea on exertion, edema, orthopnea, palpitations, paroxysmal nocturnal dyspnea or shortness of breath Dermatological: negative for  rash Respiratory: negative for cough or wheezing Urologic: negative for hematuria Abdominal: negative for nausea, vomiting, diarrhea, bright red blood per rectum, melena, or hematemesis Neurologic: negative for visual changes, syncope, or dizziness All other systems reviewed and are otherwise negative except as noted above.    Blood pressure 106/72, pulse 76, height 5\' 2"  (1.575 m), weight 99 lb (44.9 kg).  General appearance: alert and no distress Neck: no adenopathy, no carotid bruit, no JVD, supple, symmetrical, trachea midline and thyroid not enlarged, symmetric, no tenderness/mass/nodules Lungs: clear to auscultation bilaterally Heart: regular rate and rhythm, S1, S2 normal, no murmur, click, rub or gallop Extremities: extremities normal, atraumatic, no cyanosis or edema  EKG not performed today  ASSESSMENT AND PLAN:   Chest pain Jaclyn Lucero is a 30 year old appearing married Caucasian female who was referred today for evaluation of new onset chest pain. 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 She has no cardiac risk factors. Approximately a week and a half ago she noticed substernal chest pain with exertion as well as at rest with radiation to her left upper extremity. She has been awakened from sleep with chest pain. She is also noticed some increasing shortness of breath. She is on birth control pills. I am going to the echo and exercise Myoview to rule out an ischemic etiology. I'll see her back after that for further evaluation.      Lorretta Harp MD FACP,FACC,FAHA, [facility] 02/11/2016 4:17 PM

## 2016-02-12 ENCOUNTER — Telehealth (HOSPITAL_COMMUNITY): Payer: Self-pay | Admitting: *Deleted

## 2016-02-12 NOTE — Telephone Encounter (Signed)
This looks like this was performed yesterday.  You can send

## 2016-02-12 NOTE — Telephone Encounter (Signed)
Encounter complete. 

## 2016-02-13 ENCOUNTER — Ambulatory Visit (HOSPITAL_COMMUNITY)
Admission: RE | Admit: 2016-02-13 | Discharge: 2016-02-13 | Disposition: A | Discharge status: Home / Self Care | Payer: BLUE CROSS/BLUE SHIELD | Source: Ambulatory Visit | Attending: Cardiology | Admitting: Cardiology

## 2016-02-13 DIAGNOSIS — Z8249 Family history of ischemic heart disease and other diseases of the circulatory system: Secondary | ICD-10-CM | POA: Insufficient documentation

## 2016-02-13 DIAGNOSIS — R5383 Other fatigue: Secondary | ICD-10-CM | POA: Diagnosis not present

## 2016-02-13 DIAGNOSIS — R55 Syncope and collapse: Secondary | ICD-10-CM | POA: Diagnosis not present

## 2016-02-13 DIAGNOSIS — R42 Dizziness and giddiness: Secondary | ICD-10-CM | POA: Insufficient documentation

## 2016-02-13 DIAGNOSIS — R0609 Other forms of dyspnea: Secondary | ICD-10-CM | POA: Insufficient documentation

## 2016-02-13 DIAGNOSIS — R079 Chest pain, unspecified: Secondary | ICD-10-CM | POA: Insufficient documentation

## 2016-02-13 MED ORDER — TECHNETIUM TC 99M TETROFOSMIN IV KIT
28.4000 | PACK | Freq: Once | INTRAVENOUS | Status: AC | PRN
  Administered 2016-02-13: 28.4 via INTRAVENOUS
  Filled 2016-02-13: qty 28

## 2016-02-13 MED ORDER — TECHNETIUM TC 99M TETROFOSMIN IV KIT
9.7000 | PACK | Freq: Once | INTRAVENOUS | Status: AC | PRN
  Administered 2016-02-13: 9.7 via INTRAVENOUS
  Filled 2016-02-13: qty 10

## 2016-02-15 LAB — MYOCARDIAL PERFUSION IMAGING
CHL CUP MPHR: 191 {beats}/min
CHL CUP NUCLEAR SRS: 0
CHL CUP RESTING HR STRESS: 48 {beats}/min
CSEPEDS: 1 s
CSEPHR: 92 %
CSEPPHR: 176 {beats}/min
Estimated workload: 17.2 METS
Exercise duration (min): 15 min
LV sys vol: 34 mL
LVDIAVOL: 82 mL (ref 46–106)
RPE: 16
SDS: 2
SSS: 2
TID: 0.97

## 2016-02-18 ENCOUNTER — Other Ambulatory Visit: Payer: Self-pay

## 2016-02-18 ENCOUNTER — Ambulatory Visit (HOSPITAL_COMMUNITY): Payer: BLUE CROSS/BLUE SHIELD | Attending: Cardiovascular Disease

## 2016-02-18 DIAGNOSIS — I071 Rheumatic tricuspid insufficiency: Secondary | ICD-10-CM | POA: Diagnosis not present

## 2016-02-18 DIAGNOSIS — R079 Chest pain, unspecified: Secondary | ICD-10-CM | POA: Insufficient documentation

## 2016-07-27 ENCOUNTER — Ambulatory Visit (INDEPENDENT_AMBULATORY_CARE_PROVIDER_SITE_OTHER): Payer: BC Managed Care – PPO | Admitting: Family Medicine

## 2016-07-27 VITALS — BP 124/82 | HR 71 | Temp 98.5°F | Resp 17 | Ht 62.5 in | Wt 99.0 lb

## 2016-07-27 DIAGNOSIS — R6889 Other general symptoms and signs: Secondary | ICD-10-CM | POA: Diagnosis not present

## 2016-07-27 MED ORDER — OSELTAMIVIR PHOSPHATE 75 MG PO CAPS: 75.0000 mg | ORAL_CAPSULE | Freq: Two times a day (BID) | ORAL | 0 refills | Status: DC

## 2016-07-27 MED ORDER — BENZONATATE 100 MG PO CAPS
100.0000 mg | ORAL_CAPSULE | Freq: Three times a day (TID) | ORAL | 0 refills | Status: DC | PRN
Start: 2016-07-27 — End: 2016-11-06

## 2016-07-27 NOTE — Patient Instructions (Addendum)
Start Tamiflu 75 mg,twice daily for 5 days. Take benzonatate 100-200 mg up to 3 times daily for cough. If you develop a fever, please refrain from returning to work until you are fever free for  24 hours without Tylenol or Ibuprofen.    IF you received an x-ray today, you will receive an invoice from [facility]. Please contact [facility]iology at [phone] with questions or concerns regarding your invoice.   IF you received labwork today, you will receive an invoice from [facility]. Please contact [facility] at [phone] with questions or concerns regarding your invoice.   Our billing staff will not be able to assist you with questions regarding bills from these companies.  You will be contacted with the lab results as soon as they are available. The fastest way to get your results is to activate your My Chart account. Instructions are located on the last page of this paperwork. If you have not heard from [place] regarding the results in 2 weeks, please contact this office.     Influenza, Adult Influenza, more commonly known as "the flu," is a viral infection that primarily affects the respiratory tract. The respiratory tract includes organs that help you breathe, such as the lungs, nose, and throat. The flu causes many common cold symptoms, as well as a high fever and body aches. The flu spreads easily from person to person (is contagious). Getting a flu shot (influenza vaccination) every year is the best way to prevent influenza. What are the causes? Influenza is caused by a virus. You can catch the virus by:  Breathing in droplets from an infected person's cough or sneeze.  Touching something that was recently contaminated with the virus and then touching your mouth, nose, or eyes. What increases the risk? The following factors may make you more likely to get the flu:  Not cleaning your hands frequently with soap and water or alcohol-based hand sanitizer.  Having close  contact with many people during cold and flu season.  Touching your mouth, eyes, or nose without washing or sanitizing your hands first.  Not drinking enough fluids or not eating a healthy diet.  Not getting enough sleep or exercise.  Being under a high amount of stress.  Not getting a yearly (annual) flu shot. You may be at a higher risk of complications from the flu, such as a severe lung infection (pneumonia), if you:  Are over the age of 72.  Are pregnant.  Have a weakened disease-fighting system (immune system). You may have a weakened immune system if you:  Have HIV or AIDS.  Are undergoing chemotherapy.  Aretaking medicines that reduce the activity of (suppress) the immune system.  Have a long-term (chronic) illness, such as heart disease, kidney disease, diabetes, or lung disease.  Have a liver disorder.  Are obese.  Have anemia. What are the signs or symptoms? Symptoms of this condition typically last 4-10 days and may include:  Fever.  Chills.  Headache, body aches, or muscle aches.  Sore throat.  Cough.  Runny or congested nose.  Chest discomfort and cough.  Poor appetite.  Weakness or tiredness (fatigue).  Dizziness.  Nausea or vomiting. How is this diagnosed? This condition may be diagnosed based on your medical history and a physical exam. Your health care provider may do a nose or throat swab test to confirm the diagnosis. How is this treated? If influenza is detected early, you can be treated with antiviral medicine that can reduce the length of your  illness and the severity of your symptoms. This medicine may be given by mouth (orally) or through an IV tube that is inserted in one of your veins. The goal of treatment is to relieve symptoms by taking care of yourself at home. This may include taking over-the-counter medicines, drinking plenty of fluids, and adding humidity to the air in your home. In some cases, influenza goes away on its  own. Severe influenza or complications from influenza may be treated in a hospital. Follow these instructions at home:  Take over-the-counter and prescription medicines only as told by your health care provider.  Use a cool mist humidifier to add humidity to the air in your home. This can make breathing easier.  Rest as needed.  Drink enough fluid to keep your urine clear or pale yellow.  Cover your mouth and nose when you cough or sneeze.  Wash your hands with soap and water often, especially after you cough or sneeze. If soap and water are not available, use hand sanitizer.  Stay home from work or school as told by your health care provider. Unless you are visiting your health care provider, try to avoid leaving home until your fever has been gone for 24 hours without the use of medicine.  Keep all follow-up visits as told by your health care provider. This is important. How is this prevented?  Getting an annual flu shot is the best way to avoid getting the flu. You may get the flu shot in late summer, fall, or winter. Ask your health care provider when you should get your flu shot.  Wash your hands often or use hand sanitizer often.  Avoid contact with people who are sick during cold and flu season.  Eat a healthy diet, drink plenty of fluids, get enough sleep, and exercise regularly. Contact a health care provider if:  You develop new symptoms.  You have:  Chest pain.  Diarrhea.  A fever.  Your cough gets worse.  You produce more mucus.  You feel nauseous or you vomit. Get help right away if:  You develop shortness of breath or difficulty breathing.  Your skin or nails turn a bluish color.  You have severe pain or stiffness in your neck.  You develop a sudden headache or sudden pain in your face or ear.  You cannot stop vomiting. This information is not intended to replace advice given to you by your health care provider. Make sure you discuss any questions  you have with your health care provider. Document Released: 06/04/2000 Document Revised: 11/13/2015 Document Reviewed: 04/01/2015 Elsevier Interactive Patient Education  2017 [employer].

## 2016-07-27 NOTE — Progress Notes (Signed)
Patient ID: Jaclyn Lucero, female    DOB: [birth year], 31 y.o.   MRN: [id number]  PCP: No PCP Per Patient  Chief Complaint  Patient presents with  . Cough  . Nasal Congestion  . Sore Throat    Subjective:  HPI 31 year old female presents for evaluation of cough, nasal congestion, and sore throat over the last few days. Everyone in her office has been sick with a few individuals testing positive for influenza. Pt reports runny nose, cough, stiff neck, chills,  "feeling feverish", chest congestion, fatigue, increasingly worsening headache, generalized body aches with mild sore throat. She reports diminished food and fluid intake.  Social History   Social History  . Marital status: Married    Spouse name: N/A  . Number of children: N/A  . Years of education: N/A   Occupational History  . Not on file.   Social History Main Topics  . Smoking status: Never Smoker  . Smokeless tobacco: Never Used  . Alcohol use Yes     Comment: [employer]  . Drug use: Unknown  . Sexual activity: Not on file   Other Topics Concern  . Not on file   Social History Narrative  . No narrative on file    Family History  Problem Relation Age of Onset  . Hypertension Maternal Grandmother    Review of Systems See HPI  Patient Active Problem List   Diagnosis Date Noted  . Chest pain 02/11/2016  . Pain, foot, right, chronic 04/21/2012  . History of anxiety 03/19/2012  . Health care maintenance 03/19/2012  . History of abnormal Pap smear 03/19/2012  . History of migraine headaches 03/19/2012    No Known Allergies  Prior to Admission medications   Medication Sig Start Date End Date Taking? Authorizing Provider  aspirin 325 MG tablet Take 325 mg by mouth daily.   Yes Historical Provider, MD  norethindrone-ethinyl estradiol-iron (MICROGESTIN FE,GILDESS FE,LOESTRIN FE) 1.5-30 MG-MCG tablet Take 1 tablet by mouth daily.   Yes Historical Provider, MD    Past Medical,  Surgical Family and Social History reviewed and updated.    Objective:   Today's Vitals   07/27/16 1050  BP: 124/82  Pulse: 71  Resp: 17  Temp: 98.5 F (36.9 C)  TempSrc: Oral  SpO2: 97%  Weight: 99 lb (44.9 kg)  Height: 5' 2.5" (1.588 m)   Wt Readings from Last 3 Encounters:  07/27/16 99 lb (44.9 kg)  02/13/16 99 lb (44.9 kg)  02/11/16 99 lb (44.9 kg)   Physical Exam  Constitutional: She is oriented to person, place, and time. She appears well-developed and well-nourished. She has a sickly appearance.  HENT:  Head: Normocephalic.  Right Ear: Hearing, tympanic membrane, external ear and ear canal normal.  Left Ear: Hearing, external ear and ear canal normal.  Nose: Mucosal edema and rhinorrhea present.  Mouth/Throat: Oropharynx is clear and moist.  Eyes: Pupils are equal, round, and reactive to light. Right eye exhibits no discharge. Left eye exhibits no discharge.  Neck: Normal range of motion. Neck supple.  Cardiovascular: Normal rate, regular rhythm, normal heart sounds and intact distal pulses.   Pulmonary/Chest: Effort normal and breath sounds normal.  Abdominal: Soft. Bowel sounds are normal.  Musculoskeletal: Normal range of motion.  Neurological: She is alert and oriented to person, place, and time.  Skin: Skin is warm.  Psychiatric: She has a normal mood and affect. Her behavior is normal. Thought content normal.  Assessment & Plan:  1. Flu-like symptoms, unable to confirm influenza due to office is out of rapid flu tests.  Plan: Start Tamiflu 75 mg,twice daily for 5 days. Take benzonatate 100-200 mg up to 3 times daily for cough. If you develop a fever, please refrain from returning to work until you are fever free for  24 hours without Tylenol or Ibuprofen.   Carroll Sage. Kenton Kingfisher, MSN, FNP-C [facility] [place]

## 2016-11-01 ENCOUNTER — Ambulatory Visit: Payer: BC Managed Care – PPO | Admitting: Physician Assistant

## 2016-11-06 ENCOUNTER — Encounter: Payer: Self-pay | Admitting: Physician Assistant

## 2016-11-06 ENCOUNTER — Ambulatory Visit (INDEPENDENT_AMBULATORY_CARE_PROVIDER_SITE_OTHER): Payer: BC Managed Care – PPO | Admitting: Physician Assistant

## 2016-11-06 VITALS — BP 102/61 | HR 50 | Temp 97.8°F | Ht 62.5 in | Wt 96.4 lb

## 2016-11-06 DIAGNOSIS — M546 Pain in thoracic spine: Secondary | ICD-10-CM

## 2016-11-06 DIAGNOSIS — R609 Edema, unspecified: Secondary | ICD-10-CM

## 2016-11-06 LAB — POCT URINALYSIS DIP (MANUAL ENTRY)
Bilirubin, UA: NEGATIVE
Blood, UA: NEGATIVE
Glucose, UA: NEGATIVE mg/dL
Ketones, POC UA: NEGATIVE mg/dL
LEUKOCYTES UA: NEGATIVE
NITRITE UA: NEGATIVE
PROTEIN UA: NEGATIVE mg/dL
Spec Grav, UA: <=1.005 — AB (ref 1.010–1.025)
UROBILINOGEN UA: 0.2 U/dL
pH, UA: 6.5 (ref 5.0–8.0)

## 2016-11-06 NOTE — Patient Instructions (Addendum)
Make sure that you are getting adequate hydration and calories. You can take 600 mg of ibuprofen three times daily with meals if needed for the chest wall pain.    IF you received an x-ray today, you will receive an invoice from [facility]. Please contact [facility] at [phone] with questions or concerns regarding your invoice.   IF you received labwork today, you will receive an invoice from [place]. Please contact [facility] at [phone] with questions or concerns regarding your invoice.   Our billing staff will not be able to assist you with questions regarding bills from these companies.  You will be contacted with the lab results as soon as they are available. The fastest way to get your results is to activate your My Chart account. Instructions are located on the last page of this paperwork. If you have not heard from [place] regarding the results in 2 weeks, please contact this office.

## 2016-11-06 NOTE — Progress Notes (Signed)
Patient ID: Jaclyn Lucero, female    DOB: 09-13-1985, 31 y.o.   MRN: [id number]  PCP: Patient, No Pcp Per  Chief Complaint  Patient presents with  . Back Pain    Hurts to twist and radiates to abdomen    Subjective:   Presents for evaluation of RIGHT sided back pain.  The pain began 2.5 weeks ago while doing Snatches, an exercise that involves squatting down, lifting a dumbell/kettlebell and raising it over the head. She has tried 200 mg of ibuprofen on a couple of occasions since then, but without relief. Taking a deep breath increases the pain, as does twisting. The pain shoots around the lower rib to the front of the chest.  In addition, she would like her kidney function checked. She is preparing for a body building contest, her first, in 7 days. She is restricting her eating and using an OTC herbal diuretic. She's noticed that her ankles gets swollen and her legs feel heavy. She describes her level of fitness as poor and her core strength "weak" at present.  Some increased urination and urgency associated with diuretic use. No hematuria. No nausea, vomiting or diarrhea.   Review of Systems As above. No CP, SOB, HA, dizziness.    Patient Active Problem List   Diagnosis Date Noted  . Chest pain 02/11/2016  . Pain, foot, right, chronic 04/21/2012  . History of anxiety 03/19/2012  . Health care maintenance 03/19/2012  . History of abnormal Pap smear 03/19/2012  . History of migraine headaches 03/19/2012     Prior to Admission medications   Medication Sig Start Date End Date Taking? Authorizing Provider  aspirin 81 MG tablet 3 to 4 tablets daily as needed   Yes [provider]  JUNEL FE 1/20 1-20 MG-MCG tablet Take 1 tablet by mouth daily.  11/01/16  Yes [provider]  NON FORMULARY MHP XPEL (Herbal Diuertic)  4 tablets twice a day   Yes [provider]     No Known Allergies     Objective:  Physical Exam  Constitutional:  She is oriented to person, place, and time. She appears well-developed. She is active and cooperative. She does not have a sickly appearance. No distress.  BP 102/61   Pulse (!) 50   Temp 97.8 F (36.6 C) (Oral)   Ht 5' 2.5" (1.588 m)   Wt 96 lb 6 oz (43.7 kg)   SpO2 99%   BMI 17.35 kg/m   HENT:  Head: Normocephalic and atraumatic.  Right Ear: Hearing normal.  Left Ear: Hearing normal.  Eyes: Conjunctivae are normal. No scleral icterus.  Neck: Normal range of motion. Neck supple. No thyromegaly present.  Cardiovascular: Regular rhythm and normal heart sounds.   No extrasystoles are present. Bradycardia present.   Pulses:      Radial pulses are 2+ on the right side, and 2+ on the left side.  Pulmonary/Chest: Effort normal and breath sounds normal.  Lymphadenopathy:       Head (right side): No tonsillar, no preauricular, no posterior auricular and no occipital adenopathy present.       Head (left side): No tonsillar, no preauricular, no posterior auricular and no occipital adenopathy present.    She has no cervical adenopathy.       Right: No supraclavicular adenopathy present.       Left: No supraclavicular adenopathy present.  Neurological: She is alert and oriented to person, place, and time. No sensory deficit.  Skin:  Skin is warm, dry and intact. No rash noted. No cyanosis or erythema. Nails show no clubbing.  Psychiatric: She has a normal mood and affect. Her speech is normal and behavior is normal.    Results for orders placed or performed in visit on 11/06/16  POCT urinalysis dipstick  Result Value Ref Range   Color, UA yellow yellow   Clarity, UA clear clear   Glucose, UA negative negative mg/dL   Bilirubin, UA negative negative   Ketones, POC UA negative negative mg/dL   Spec Grav, UA <=1.005 (A) 1.010 - 1.025   Blood, UA negative negative   pH, UA 6.5 5.0 - 8.0   Protein Ur, POC negative negative mg/dL   Urobilinogen, UA 0.2 0.2 or 1.0 E.U./dL   Nitrite, UA  Negative Negative   Leukocytes, UA Negative Negative    Wt Readings from Last 3 Encounters:  11/06/16 96 lb 6 oz (43.7 kg)  07/27/16 99 lb (44.9 kg)  02/13/16 99 lb (44.9 kg)       Assessment & Plan:   1. Acute right-sided thoracic back pain Likely musculoskeletal strain. Heat. Rest.  - POCT urinalysis dipstick  2. Edema, unspecified type Likely due to calorie restriction, +/- OTC diuretic. Increase calories. Await lab results.  - Basic metabolic panel - Care order/instruction:   Return if symptoms worsen or fail to improve.   Fara Chute, PA-C [facility] [place]

## 2016-11-07 LAB — BASIC METABOLIC PANEL
BUN/Creatinine Ratio: 31 — ABNORMAL HIGH (ref 9–23)
BUN: 29 mg/dL — ABNORMAL HIGH (ref 6–20)
CO2: 23 mmol/L (ref 18–29)
CREATININE: 0.93 mg/dL (ref 0.57–1.00)
Calcium: 9.4 mg/dL (ref 8.7–10.2)
Chloride: 99 mmol/L (ref 96–106)
GFR calc Af Amer: 95 mL/min/{1.73_m2} (ref >59)
GFR calc non Af Amer: 83 mL/min/{1.73_m2} (ref >59)
GLUCOSE: 76 mg/dL (ref 65–99)
POTASSIUM: 4.5 mmol/L (ref 3.5–5.2)
SODIUM: 139 mmol/L (ref 134–144)

## 2017-03-09 ENCOUNTER — Ambulatory Visit (INDEPENDENT_AMBULATORY_CARE_PROVIDER_SITE_OTHER): Payer: BC Managed Care – PPO | Admitting: Physician Assistant

## 2017-03-09 ENCOUNTER — Ambulatory Visit (INDEPENDENT_AMBULATORY_CARE_PROVIDER_SITE_OTHER): Payer: BC Managed Care – PPO

## 2017-03-09 ENCOUNTER — Encounter: Payer: Self-pay | Admitting: Physician Assistant

## 2017-03-09 VITALS — BP 96/61 | HR 55 | Temp 98.4°F | Resp 16 | Ht 62.5 in | Wt 104.6 lb

## 2017-03-09 DIAGNOSIS — R1011 Right upper quadrant pain: Secondary | ICD-10-CM

## 2017-03-09 DIAGNOSIS — K5900 Constipation, unspecified: Secondary | ICD-10-CM

## 2017-03-09 DIAGNOSIS — R079 Chest pain, unspecified: Secondary | ICD-10-CM | POA: Diagnosis not present

## 2017-03-09 LAB — POCT URINALYSIS DIP (MANUAL ENTRY)
Bilirubin, UA: NEGATIVE
Blood, UA: NEGATIVE
Glucose, UA: NEGATIVE mg/dL
Ketones, POC UA: NEGATIVE mg/dL
Leukocytes, UA: NEGATIVE
Nitrite, UA: NEGATIVE
Protein Ur, POC: NEGATIVE mg/dL
Spec Grav, UA: 1.01 (ref 1.010–1.025)
Urobilinogen, UA: 0.2 U/dL
pH, UA: 6.5 (ref 5.0–8.0)

## 2017-03-09 MED ORDER — POLYETHYLENE GLYCOL 3350 17 GM/SCOOP PO POWD: 17.0000 g | Freq: Two times a day (BID) | ORAL | 1 refills | Status: DC | PRN

## 2017-03-09 NOTE — Progress Notes (Signed)
Jaclyn Lucero  MRN: [id number] DOB: [birth year]  PCP: Patient, No Pcp Per  Subjective:  Pt is a pleasant 31 year old female who presents to clinic for right rib pain x 3 weeks.   She C/o a constant "dull aching" anterior right side. Present all the time.  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 Occasional sharp pains in chest with workouts.  No pain with deep inspiration. No assoc with food. Does not wake her up at night. Nothing makes it feel better. Gets worse, but not assoc with anything.  No change in bowel movements, abdominal pain, n/v/d.  She is taking Vitamn C, CLA, sometimes creatine, PCAs, zinc, vitamin B, alpha lapioc acid.  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  No new stressors.  No cardiac history FHx: MGM HTN  She was evaluated by cardiology one year ago. Negative work-up.   Review of Systems  Constitutional: Negative for chills, diaphoresis, fatigue and unexpected weight change.  Respiratory: Positive for shortness of breath.   Cardiovascular: Positive for chest pain.    Patient Active Problem List   Diagnosis Date Noted  . Chest pain 02/11/2016  . Pain, foot, right, chronic 04/21/2012  . History of anxiety 03/19/2012  . Health care maintenance 03/19/2012  . History of abnormal Pap smear 03/19/2012  . History of migraine headaches 03/19/2012    Current Outpatient Prescriptions on File Prior to Visit  Medication Sig Dispense Refill  . aspirin 81 MG tablet 3 to 4 tablets daily as needed    . JUNEL FE 1/20 1-20 MG-MCG tablet Take 1 tablet by mouth daily.     . methocarbamol (ROBAXIN) 500 MG tablet TK 1 T PO TID  0  . NON FORMULARY MHP XPEL (Herbal Diuertic)  4 tablets twice a day     No current facility-administered medications on file prior to visit.     No Known Allergies   Objective:  BP  96/61   Pulse (!) 55   Temp 98.4 F (36.9 C) (Oral)   Resp 16   Ht 5' 2.5" (1.588 m)   Wt 104 lb 9.6 oz (47.4 kg)   SpO2 98%   BMI 18.83 kg/m   Physical Exam  Constitutional: She is oriented to person, place, and time and well-developed, well-nourished, and in no distress. No distress.  Cardiovascular: Normal rate, regular rhythm and normal heart sounds.   Abdominal: Soft. Normal appearance and bowel sounds are normal. There is no hepatosplenomegaly. There is no tenderness. There is negative Murphy's sign.  Musculoskeletal:       Right ankle: She exhibits no swelling.       Left ankle: She exhibits no swelling.  Neurological: She is alert and oriented to person, place, and time. GCS score is 15.  Skin: Skin is warm and dry.  Psychiatric: Mood, memory, affect and judgment normal.  Vitals reviewed.  Results for orders placed or performed in visit on 03/09/17  POCT urinalysis dipstick  Result Value Ref Range   Color, UA yellow yellow   Clarity, UA clear clear   Glucose, UA negative negative mg/dL   Bilirubin, UA negative negative   Ketones, POC UA negative negative mg/dL   Spec Grav, UA 1.010 1.010 - 1.025   Blood, UA negative negative   pH, UA 6.5 5.0 - 8.0   Protein  Ur, POC negative negative mg/dL   Urobilinogen, UA 0.2 0.2 or 1.0 E.U./dL   Nitrite, UA Negative Negative   Leukocytes, UA Negative Negative   Dg Chest 2 View  Result Date: 03/09/2017 CLINICAL DATA:  Chest pain for 3 weeks. EXAM: CHEST  2 VIEW COMPARISON:  None. FINDINGS: The heart size and mediastinal contours are within normal limits. Both lungs are clear. No evidence of pneumothorax or pleural effusion. The visualized skeletal structures are unremarkable. IMPRESSION: Negative.  No active cardiopulmonary disease. Electronically Signed   By: Earle Gell M.D.   On: 03/09/2017 17:35   Dg Abd 2 Views  Result Date: 03/09/2017 CLINICAL DATA:  Right upper quadrant pain for 2 weeks. EXAM: ABDOMEN - 2 VIEW COMPARISON:   07/13/2011 FINDINGS: The bowel gas pattern is normal. There is no evidence of free air. No radio-opaque calculi. Moderate to large colonic stool burden noted. IMPRESSION: No acute findings.  Moderate to large stool burden. Electronically Signed   By: Earle Gell M.D.   On: 03/09/2017 17:36    Myocardial stress test 01/2016 Results: There was no ST segment deviation noted during stress.  There were no significant arrhythmias noted during the test.  ECG was interpretable and there was no significant change from baseline.  Echocardiogram 01/2016 Results:  - Left ventricle: The cavity size was normal. Systolic function was   normal. The estimated ejection fraction was in the range of 60%   to 65%. Wall motion was normal; there were no regional wall   motion abnormalities. Left ventricular diastolic function   parameters were normal. - Aortic valve: Transvalvular velocity was within the normal range.   There was no stenosis. There was no regurgitation. - Mitral valve: Transvalvular velocity was within the normal range.   There was no evidence for stenosis. There was no regurgitation. - Right ventricle: The cavity size was normal. Wall thickness was   normal. Systolic function was normal. - Tricuspid valve: There was trivial regurgitation. - Pulmonary arteries: Systolic pressure was within the normal   range. PA peak pressure: 19 mm Hg (S).  Cardiologist Plan 01/2016: "No need for follow up unless she wants to discuss in person as long as she knows that her tests are nl"  EKG - nsr, no ST elevation or t-wave inversion.  Assessment and Plan :  1. Constipation, unspecified constipation type 2. Right upper quadrant abdominal pain - polyethylene glycol powder (GLYCOLAX/MIRALAX) powder; Take 17 g by mouth 2 (two) times daily as needed.  Dispense: 578 g; Refill: 1 - DG Abd 2 Views; Future - CMP14+EGFR - CBC with Differential/Platelet - Amylase - Lipase - pt has a negative cardiac work-up today.  Negative POCT urine.  Abdominal x-ray shows a moderate to large colonic stool burden - suspect her pain is 2/2 this problem. Plan to treat with Miralax. RTC in 3 weeks if no relief. She agrees with plan.  3. Chest pain, unspecified type - EKG 12-Lead - DG Chest 2 View; Future - POCT urinalysis dipstick - POCT Microscopic Urinalysis ([facility]) - Negative   Mercer Pod, PA-C  [facility] [place] 03/09/2017 3:56 PM

## 2017-03-09 NOTE — Patient Instructions (Addendum)
Your EKG looks great!  You will be contacted with the results of today's lab work.   Thank you for coming in today. I hope you feel we met your needs.  Feel free to call PCP if you have any questions or further requests.  Please consider signing up for MyChart if you do not already have it, as this is a great way to communicate with me.  Best,  Whitney McVey, PA-C  For constipation   1) Water: Make sure you are drinking enough water daily - about 1-3 liters. 2) Fiber: Make sure you are getting enough fiber in your diet - this will make you regular - you can eat high fiber foods or use metamucil as a supplement - it is really important to drink enough water when using fiber supplements. Foods that have a lot of fiber include vegetables, fruits, beans, nuts, oatmeal, and some breads and cereals. You can tell how much fiber is in a food by reading the nutrition label. Doctors recommend eating 25 to 36 grams of fiber each day. 3) Fitness: Increasing your physical activity will help increase the natural movement of your bowels. Try to get 20-30 minutes of exercise daily.   If your stools are hard or are formed balls or you have to strain a stool softener will help - use colace 2-3 capsule a day  Option 1) For gentle treatment of constipation Use Miralax 1-2 capfuls a day until your stools are soft and regular and then decrease the usage - you can use this daily  Option 2) For more aggressive treatment of constipation Use 4 capfuls of Colace and 6 doses of Miralax and drink it in 2 hours - this should result in several watery stools - if it does not repeat the next day and then go to daily miralax for a week to make sure your bowels are clean and retrained to work properly  Option 3) For the most aggressive treatment of constipation Use 14 capfuls of Miralax in 1 gallon of fluid (gatoraid or water work well or a combination of the two) and drink over 12h - it is ok to eat during this time and then  use Miralax 1 capful daily for about 2 weeks to prevent the constipation from returning  IF you received an x-ray today, you will receive an invoice from [facility]. Please contact [facility] at [phone] with questions or concerns regarding your invoice.   IF you received labwork today, you will receive an invoice from [place]. Please contact [facility] at [phone] with questions or concerns regarding your invoice.   Our billing staff will not be able to assist you with questions regarding bills from these companies.  You will be contacted with the lab results as soon as they are available. The fastest way to get your results is to activate your My Chart account. Instructions are located on the last page of this paperwork. If you have not heard from [place] regarding the results in 2 weeks, please contact this office.

## 2017-03-10 LAB — CMP14+EGFR
ALT: 22 IU/L (ref 0–32)
AST: 38 IU/L (ref 0–40)
Albumin/Globulin Ratio: 1.7 (ref 1.2–2.2)
Albumin: 4.6 g/dL (ref 3.5–5.5)
Alkaline Phosphatase: 49 IU/L (ref 39–117)
BUN/Creatinine Ratio: 27 — ABNORMAL HIGH (ref 9–23)
BUN: 23 mg/dL — ABNORMAL HIGH (ref 6–20)
Bilirubin Total: 0.4 mg/dL (ref 0.0–1.2)
CO2: 27 mmol/L (ref 20–29)
Calcium: 9.9 mg/dL (ref 8.7–10.2)
Chloride: 100 mmol/L (ref 96–106)
Creatinine, Ser: 0.86 mg/dL (ref 0.57–1.00)
GFR calc Af Amer: 104 mL/min/{1.73_m2} (ref >59)
GFR calc non Af Amer: 90 mL/min/{1.73_m2} (ref >59)
Globulin, Total: 2.7 g/dL (ref 1.5–4.5)
Glucose: 85 mg/dL (ref 65–99)
Potassium: 4.5 mmol/L (ref 3.5–5.2)
Sodium: 140 mmol/L (ref 134–144)
Total Protein: 7.3 g/dL (ref 6.0–8.5)

## 2017-03-10 LAB — AMYLASE: Amylase: 71 U/L (ref 31–124)

## 2017-03-10 LAB — LIPASE: Lipase: 59 U/L (ref 14–72)

## 2017-03-10 LAB — CBC WITH DIFFERENTIAL/PLATELET
Basophils Absolute: 0 10*3/uL (ref 0.0–0.2)
Basos: 1 %
EOS (ABSOLUTE): 0 10*3/uL (ref 0.0–0.4)
Eos: 1 %
Hematocrit: 38.6 % (ref 34.0–46.6)
Hemoglobin: 13.2 g/dL (ref 11.1–15.9)
Immature Grans (Abs): 0 10*3/uL (ref 0.0–0.1)
Immature Granulocytes: 0 %
Lymphocytes Absolute: 1.8 10*3/uL (ref 0.7–3.1)
Lymphs: 33 %
MCH: 30.7 pg (ref 26.6–33.0)
MCHC: 34.2 g/dL (ref 31.5–35.7)
MCV: 90 fL (ref 79–97)
Monocytes Absolute: 0.4 10*3/uL (ref 0.1–0.9)
Monocytes: 7 %
Neutrophils Absolute: 3.2 10*3/uL (ref 1.4–7.0)
Neutrophils: 58 %
Platelets: 268 10*3/uL (ref 150–379)
RBC: 4.3 x10E6/uL (ref 3.77–5.28)
RDW: 13.6 % (ref 12.3–15.4)
WBC: 5.5 10*3/uL (ref 3.4–10.8)

## 2017-03-15 ENCOUNTER — Encounter: Payer: Self-pay | Admitting: Physician Assistant

## 2017-06-20 ENCOUNTER — Other Ambulatory Visit: Payer: Self-pay

## 2017-06-20 ENCOUNTER — Ambulatory Visit: Payer: BC Managed Care – PPO | Admitting: Physician Assistant

## 2017-06-20 ENCOUNTER — Encounter: Payer: Self-pay | Admitting: Physician Assistant

## 2017-06-20 ENCOUNTER — Ambulatory Visit (INDEPENDENT_AMBULATORY_CARE_PROVIDER_SITE_OTHER): Payer: BC Managed Care – PPO

## 2017-06-20 VITALS — BP 98/60 | HR 70 | Temp 97.8°F | Resp 18 | Ht 63.11 in | Wt 103.6 lb

## 2017-06-20 DIAGNOSIS — S62647A Nondisplaced fracture of proximal phalanx of left little finger, initial encounter for closed fracture: Secondary | ICD-10-CM | POA: Diagnosis not present

## 2017-06-20 DIAGNOSIS — S6992XA Unspecified injury of left wrist, hand and finger(s), initial encounter: Secondary | ICD-10-CM

## 2017-06-20 DIAGNOSIS — N912 Amenorrhea, unspecified: Secondary | ICD-10-CM

## 2017-06-20 LAB — POCT URINE PREGNANCY: Preg Test, Ur: NEGATIVE

## 2017-06-20 NOTE — Patient Instructions (Addendum)
Please return in 10 days for reevaluation.  If any of your symptoms worsen or you develop new concerning symptoms like numbness, tingling, or change in color in the finger, return sooner.  In the meantime, continue wearing your splint daily.  Avoid any exercise with that hand.  I recommend placing ice pack on the affected area 4-5 times a day for 20 minutes at a time.  He may use over-the-counter ibuprofen every 6-8 hours as prescribed. Thank you for letting me participate in your health and well being.     IF you received an x-ray today, you will receive an invoice from [facility]. Please contact [facility] at [phone] with questions or concerns regarding your invoice.   IF you received labwork today, you will receive an invoice from [place]. Please contact [facility] at [phone] with questions or concerns regarding your invoice.   Our billing staff will not be able to assist you with questions regarding bills from these companies.  You will be contacted with the lab results as soon as they are available. The fastest way to get your results is to activate your My Chart account. Instructions are located on the last page of this paperwork. If you have not heard from [place] regarding the results in 2 weeks, please contact this office.    Finger Fracture A finger fracture is a break in any of the bones of the fingers. What are the causes? The main cause of finger fractures is traumatic injury, such as from:  An injury while playing sports.  A workplace injury.  A fall.  What increases the risk? Activities that can increase your risk of finger fractures include:  Sports.  Workplace activities that involve machinery.  A condition called osteoporosis, which can make your bones less dense and cause them to fracture more easily.  What are the signs or symptoms? The main symptoms of a broken finger are pain, bruising, and swelling shortly after the injury. Other  symptoms include:  Bruising of your finger.  Stiffness of your finger.  Exposed bones (compound or open fracture) if the fracture is severe.  How is this diagnosed? This condition is diagnosed based on a physical exam, your medical history, and your symptoms. An X-ray will also be done. How is this treated? Treatment for this condition depends on the severity of the fracture. If the bones are still in place, the finger may be splinted to keep the finger still while it heals (immobilization). If the bones are out of place, they will need to be put back into place. This may be done without surgery, or surgery may be needed. You may also be given exercises to do to regain strength and flexibility (physical therapy). Follow these instructions at home: If you have a splint:  Wear the splint as told by your health care provider. Remove it only as told by your health care provider.  Loosen the splint if your fingers tingle, become numb, or turn cold and blue.  Keep the splint clean.  If the splint is not waterproof: ? Do not let it get wet. ? Cover it with a watertight covering when you take a bath or a shower. Managing pain, stiffness, and swelling  If directed, put ice on the injured area: ? If you have a removable splint, remove it as told by your health care provider. ? Put ice in a plastic bag. ? Place a towel between your skin and the bag. ? Leave the ice on for 20  minutes, 2-3 times a day.  Move your fingers often to avoid stiffness and to lessen swelling.  Raise (elevate) the injured area above the level of your heart while you are sitting or lying down. Driving  Do not drive or use heavy machinery while taking prescription pain medicine.  Ask your health care provider when it is safe to drive if you have a splint. General instructions  Do not put pressure on any part of the splint until it is fully hardened. This may take several hours.  Do not use any products that  contain nicotine or tobacco, such as cigarettes and e-cigarettes. These can delay bone healing. If you need help quitting, ask your health care provider.  Take over-the-counter and prescription medicines only as told by your health care provider.  Do exercises as told by your health care provider.  Keep all follow-up visits as told by your health care provider. This is important. Contact a health care provider if:  Your pain or swelling gets worse even with treatment.  You have trouble moving your finger. Get help right away if:  Your finger becomes numb or blue. Summary  A finger fracture is a break in any of the bones of the fingers.  Traumatic injury is the main cause of finger fractures.  Treatment for this condition depends on the severity of the fracture. This information is not intended to replace advice given to you by your health care provider. Make sure you discuss any questions you have with your health care provider. Document Released: 09/19/2000 Document Revised: 04/27/2016 Document Reviewed: 04/27/2016 Elsevier Interactive Patient Education  [employer].

## 2017-06-20 NOTE — Progress Notes (Signed)
Jaclyn Lucero  MRN: [id number] DOB: Oct 21, 1985  Subjective:  Jaclyn Lucero is a 31 y.o. female seen in office today for a chief complaint of pinky injury times 1 day ago.  Patient notes she was doing box jumps yesterday at her workout facility when she almost didn't clear the box, she reached out with her hand and hit it against the box. Later that day,she had pain and swelling at the left pinky region. Noticed some overlying bruising.  She tried icing it last night but has continued to have pain and swelling.  She has not tried any over-the-counter NSAIDs.  Denies numbness, tingling, and change in color of finger.  She did not have any other hand pain or wrist pain. LMP about 10 months ago. She is not currently on any OCP.  Of note,she does body building competitions and is therefore trying to build muscle mass with limited body fat.  She is consuming at least 1800-2100 cal a day but is working out at least once to twice a day every single day.  She has a Psychiatrist who is helping her with workouts and diet.  She notes prior to 10 months ago, she was having regular cycles.  They would occur monthly and last about 5-7 days.  She denies any excess weight loss, prior fractures, abnormal hair growth on body, dizziness, lightheadedness, headache, galactorrhea, nausea, and vomiting.  She has not been evaluated for amenorrhea at this time.  Denies smoking or alcohol use.   Review of Systems  Constitutional: Negative for chills, diaphoresis and fever.  Cardiovascular: Negative for chest pain and palpitations.  Gastrointestinal: Negative for abdominal pain, constipation, diarrhea and vomiting.  Endocrine: Negative for cold intolerance, heat intolerance, polydipsia, polyphagia and polyuria.    Patient Active Problem List   Diagnosis Date Noted  . Chest pain 02/11/2016  . Pain, foot, right, chronic 04/21/2012  . History of anxiety 03/19/2012  . Health care maintenance 03/19/2012  . History of  abnormal Pap smear 03/19/2012  . History of migraine headaches 03/19/2012    Current Outpatient Medications on File Prior to Visit  Medication Sig Dispense Refill  . aspirin 81 MG tablet 3 to 4 tablets daily as needed    . polyethylene glycol powder (GLYCOLAX/MIRALAX) powder Take 17 g by mouth 2 (two) times daily as needed. 578 g 1  . JUNEL FE 1/20 1-20 MG-MCG tablet Take 1 tablet by mouth daily.     . methocarbamol (ROBAXIN) 500 MG tablet TK 1 T PO TID  0  . NON FORMULARY MHP XPEL (Herbal Diuertic)  4 tablets twice a day     No current facility-administered medications on file prior to visit.     No Known Allergies     Social History   Socioeconomic History  . Marital status: Married    Spouse name: Saralyn Pilar  . Number of children: Not on file  . Years of education: Not on file  . Highest education level: Not on file  Social Needs  . Financial resource strain: Not on file  . Food insecurity - worry: Not on file  . Food insecurity - inability: Not on file  . Transportation needs - medical: Not on file  . Transportation needs - non-medical: Not on file  Occupational History  . Not on file  Tobacco Use  . Smoking status: Never Smoker  . Smokeless tobacco: Never Used  Substance and Sexual Activity  . Alcohol use: Yes    Comment: BEER AND SOMETIMES  LIQUOR  . Drug use: No  . Sexual activity: Yes  Other Topics Concern  . Not on file  Social History Narrative  . Not on file    Objective:  BP 98/60 (BP Location: Left Arm, Patient Position: Sitting, Cuff Size: Normal)   Pulse 70   Temp 97.8 F (36.6 C) (Oral)   Resp 18   Ht 5' 3.11" (1.603 m)   Wt 103 lb 9.6 oz (47 kg)   SpO2 98%   BMI 18.29 kg/m   Physical Exam  Constitutional: She is oriented to person, place, and time and well-developed, well-nourished, and in no distress.  HENT:  Head: Normocephalic and atraumatic.  Eyes: Conjunctivae are normal.  Neck: Normal range of motion.  Pulmonary/Chest: Effort  normal.  Musculoskeletal:       Left hand: She exhibits decreased range of motion ( with flexion of 5th phalange), bony tenderness (with palpation of proximal 5th phalanx and most distal aspect of 5th metacarpal) and swelling (most notable at 5th MCP joint with overlying ecchymosis ). She exhibits normal capillary refill. Normal sensation noted. Normal strength noted.  Neurological: She is alert and oriented to person, place, and time. Gait normal.  Skin: Skin is warm and dry.  Psychiatric: Affect normal.  Vitals reviewed.  Wt Readings from Last 3 Encounters:  06/20/17 103 lb 9.6 oz (47 kg)  03/09/17 104 lb 9.6 oz (47.4 kg)  11/06/16 96 lb 6 oz (43.7 kg)     Dg Finger Little Left  Result Date: 06/20/2017 CLINICAL DATA:  Finger injury 1 day ago. Swelling, bruising, and tenderness involving the distal fifth metacarpal and proximal small finger. EXAM: LEFT LITTLE FINGER 2+V COMPARISON:  None. FINDINGS: There is slight cortical irregularity involving the ulnar aspect of the base of the proximal phalanx of the small finger on the AP radiograph without a displaced fracture identified. A nondisplaced fracture through the base of the proximal phalanx is questioned on the oblique radiograph. Superimposition of multiple metacarpals limits assessment of this region on the lateral radiograph. There is no other evidence of fracture. There is no dislocation. There is likely mild soft tissue swelling at the level of the fifth MCP joint. Joint space widths are preserved. IMPRESSION: Slight cortical irregularity of the base of the small finger proximal phalanx for which a nondisplaced fracture is not excluded. Electronically Signed   By: Logan Bores M.D.   On: 06/20/2017 09:00    Assessment and Plan :  1. Injury of finger of left hand, initial encounter - DG Finger Little Left; Future - POCT urine pregnancy 2. Closed nondisplaced fracture of proximal phalanx of left little finger, initial encounter Plain  films show slight cortical irregularity at the base of the small finger proximal phalanx for which a nondisplaced fracture is not excluded.  Will treat as fracture at this time.  Affected finger was buddy taped to the neighboring finger.  Finger splint applied.  Patient encouraged to wear splint continuously for the next 10 days. Avoid use of left hand. Apply ice to the affected area may use over-the-counter NSAIDs as needed for pain.  Plan to follow-up in 10 days for reevaluation.  If patient continues to have pain, recommend repeat imaging at that time. 3. Amenorrhea Had lengthy discussion about amenorrhea with patient.  Due to multiple workouts daily with a calorie intake of 1800-2100, patient is expending more calories than she has taking in.  BMI is 18.2.  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 Recommend FSH, serum PRL, and TSH.  She would also benefit from DEXA scan.  Furthermore, she would benefit from being on daily OCP.  After discussion, patient agrees to treatment plan but does not have time to have lab drawn today.  Notes she will have labs drawn when she returns for follow-up in 10 days.  We will also place DEXA scan order and start OCP at that time.  Patient understands and agrees to treatment plan. - POCT urine pregnancy  Tenna Delaine PA-C  [facility] 06/20/2017 9:05 AM

## 2017-07-01 ENCOUNTER — Other Ambulatory Visit: Payer: Self-pay

## 2017-07-01 ENCOUNTER — Ambulatory Visit: Payer: BC Managed Care – PPO | Admitting: Physician Assistant

## 2017-07-01 ENCOUNTER — Ambulatory Visit (INDEPENDENT_AMBULATORY_CARE_PROVIDER_SITE_OTHER): Payer: BC Managed Care – PPO

## 2017-07-01 ENCOUNTER — Encounter: Payer: Self-pay | Admitting: Physician Assistant

## 2017-07-01 VITALS — BP 102/78 | HR 47 | Temp 98.0°F | Resp 16 | Ht 63.39 in | Wt 103.0 lb

## 2017-07-01 DIAGNOSIS — S6992XD Unspecified injury of left wrist, hand and finger(s), subsequent encounter: Secondary | ICD-10-CM | POA: Diagnosis not present

## 2017-07-01 DIAGNOSIS — N912 Amenorrhea, unspecified: Secondary | ICD-10-CM

## 2017-07-01 LAB — POCT URINE PREGNANCY: Preg Test, Ur: NEGATIVE

## 2017-07-01 MED ORDER — NORETHIN ACE-ETH ESTRAD-FE 1-20 MG-MCG PO TABS: 1.0000 | ORAL_TABLET | Freq: Every day | ORAL | 1 refills | Status: AC

## 2017-07-01 NOTE — Progress Notes (Signed)
Eisha Chatterjee  MRN: [id number] DOB: 12-Mar-1986  Subjective:  Jaclyn Lucero is a 32 y.o. female seen in office today for a chief complaint of follow-up on finger injury and lab work for amenorrhea. Pt initially seen on 06/20/17 after hitting her left fifth digit on jumping box while working out. Plain films showed slight cortical irregularity at the base of the small finger proximal phalanx for which a nondisplaced fracture is not excluded. Fifth digit was buddy taped to the adjacent finger and pt was instructed to rest, ice, use NSAIDs for pain and return in 10 days for reevaluation. At that time it was also found that pt's LMP was ~10 months ago. Pregnancy urine test obtained and negative. Prior to 10 months ago she was having regular monthly cycles. She has been competing in body building competitions since around the time she stopped having menstrual cycles. Notes she gets around 1800-2100 calories a day via "clean food" like oatmeal, chicken, ground [place], and broccoli. Has a Physiological scientist. Has never seen a nutritionist. Please refer to that note for additional details. Since her last visit, she has started supplementing with multivitamin and calcium. She has continued to work out daily using her injured hand. Note she has decreased her workouts from twice daily to once daily. She has not applied ice to the affected finger but has kept it buddy taped for most of the time. She does endorse decreased libido. She denies negative body image, depression, anxiety, any excess weight loss, prior fractures, abnormal hair growth on body, dizziness, lightheadedness, headache, galactorrhea, nausea, and vomiting.   Review of Systems  Constitutional: Negative for chills, diaphoresis and fever.  Cardiovascular: Negative for chest pain and palpitations.  Neurological: Negative for weakness and numbness.  Psychiatric/Behavioral: Positive for sleep disturbance. Negative for decreased concentration.     Patient Active Problem List   Diagnosis Date Noted  . Chest pain 02/11/2016  . Pain, foot, right, chronic 04/21/2012  . History of anxiety 03/19/2012  . Health care maintenance 03/19/2012  . History of abnormal Pap smear 03/19/2012  . History of migraine headaches 03/19/2012    Current Outpatient Medications on File Prior to Visit  Medication Sig Dispense Refill  . aspirin 81 MG tablet 3 to 4 tablets daily as needed    . NON FORMULARY MHP XPEL (Herbal Diuertic)  4 tablets twice a day     No current facility-administered medications on file prior to visit.     No Known Allergies   Objective:  BP 102/78   Pulse (!) 47   Temp 98 F (36.7 C) (Oral)   Resp 16   Ht 5' 3.39" (1.61 m)   Wt 103 lb (46.7 kg)   SpO2 96%   BMI 18.02 kg/m   Physical Exam  Constitutional: She is oriented to person, place, and time and well-developed, well-nourished, and in no distress.  HENT:  Head: Normocephalic and atraumatic.  Eyes: Conjunctivae are normal.  Neck: Normal range of motion.  Pulmonary/Chest: Effort normal.  Musculoskeletal:       Left hand: She exhibits bony tenderness. She exhibits normal capillary refill and no swelling.       Hands: Neurological: She is alert and oriented to person, place, and time. Gait normal.  Skin: Skin is warm and dry.  Psychiatric: Affect normal.  Vitals reviewed.    Wt Readings from Last 3 Encounters:  07/01/17 103 lb (46.7 kg)  06/20/17 103 lb 9.6 oz (47 kg)  03/09/17 104 lb 9.6 oz (  47.4 kg)   No results found for this or any previous visit (from the past 24 hour(s)).  Dg Finger Little Left  Result Date: 07/01/2017 CLINICAL DATA:  Recent fracture with pain EXAM: LEFT FIFTH FINGER 2+V COMPARISON:  June 20, 2017 FINDINGS: Frontal, oblique, and lateral views obtained. Subtle cortical irregularity along the medial aspect of the proximal metaphysis of the fifth proximal phalanx is stable. A complete fracture in this area is not appreciable.  There is no change in alignment in this area or new evidence of fracture. Bony structures elsewhere appear normal. No dislocation. Joint spaces appear normal. No erosive change. IMPRESSION: Suspect nondisplaced incomplete fracture along the proximal medial metaphysis of the fifth proximal phalanx, stable. Appearance unchanged from 2 weeks prior. Study otherwise unremarkable. Electronically Signed   By: Lowella Grip III M.D.   On: 07/01/2017 09:19    Assessment and Plan :  1. Injury of finger of left hand, subsequent encounter Plain films with appearance unchanged form 2 weeks prior. Pt encouraged to keep finger buddy taped and apply ice to the affected area. Strongly advised to discontinue start resting hand to allow for proper healing to occur. Follow up in 2 weeks.  - DG Finger Little Left; Future  2. Amenorrhea Once again, had lengthy discussion about amenorrhea with patient. Patient is expending more calories than she has taking in.  BMI is 18.2.  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  DEXA order placed. Given Rx for OCP. However, explained that increasing calorie intake is going to be the preferred treatment. Pt notes she is going to continue with body building competitions at this time. She declines nutrionist/counseling referral but info still given to pt if she changes her mind.  - FSH/LH - TSH - Prolactin - Estradiol - VITAMIN D 25 Hydroxy (Vit-D Deficiency, Fractures) - CBC with Differential/Platelet - CMP14+EGFR - DG Bone Density; Future - POCT urine pregnancy - norethindrone-ethinyl estradiol (JUNEL FE 1/20) 1-20 MG-MCG tablet; Take 1 tablet by mouth daily.  Dispense: 3 Package; Refill: Donovan PA-C  [facility] at [facility] 07/01/2017 8:37 AM

## 2017-07-01 NOTE — Patient Instructions (Addendum)
For finger pain, please go to the [facility] and have an x-ray.  I will contact you with these results.  In the meantime, continue keeping your finger buddy taped to the adjacent finger.  Please start applying ice to the affected area and take ibuprofen as needed for pain.  Highly recommend avoiding doing activity with this finger until you are in no pain.  Please follow-up in 2 weeks for reevaluation.  In terms of amenorrhea, please start taking birth control at this time.  We will contact you within a week with your lab results.  I have also referred you to have a DEXA scan.  They should call you within the next 1-2 weeks to schedule this appointment.  [facility]     Secondary Amenorrhea Secondary amenorrhea is the stopping of menstrual flow for 3-6 months in a female who has previously had periods. There are many possible causes. Most of these causes are not serious. Usually, treating the underlying problem causing the loss of menses will return your periods to normal. What are the causes? Some common and uncommon causes of not menstruating include:  Malnutrition.  Low blood sugar (hypoglycemia).  Polycystic ovary disease.  Stress or fear.  Breastfeeding.  Hormone imbalance.  Ovarian failure.  Medicines.  Extreme obesity.  Cystic fibrosis.  Low body weight or drastic weight reduction from any cause.  Early menopause.  Removal of ovaries or uterus.  Contraceptives.  Illness.  Long-term (chronic) illnesses.  Cushing syndrome.  Thyroid problems.  Birth control pills, patches, or vaginal rings for birth control.  What increases the risk? You may be at greater risk of secondary amenorrhea if:  You have a family history of this condition.  You have an eating disorder.  You do athletic training.  How is this diagnosed? A diagnosis is made by your health care provider taking a medical history and doing a physical exam. This  will include a pelvic exam to check for problems with your reproductive organs. Pregnancy must be ruled out. Often, numerous blood tests are done to measure different hormones in the body. Urine testing may be done. Specialized exams (ultrasound, CT scan, MRI, or hysteroscopy) may have to be done as well as measuring the body mass index (BMI). How is this treated? Treatment depends on the cause of the amenorrhea. If an eating disorder is present, this can be treated with an adequate diet and therapy. Chronic illnesses may improve with treatment of the illness. Amenorrhea may be corrected with medicines, lifestyle changes, or surgery. If the amenorrhea cannot be corrected, it is sometimes possible to create a false menstruation with medicines. Follow these instructions at home:  Maintain a healthy diet.  Manage weight problems.  Exercise regularly but not excessively.  Get adequate sleep.  Manage stress.  Be aware of changes in your menstrual cycle. Keep a record of when your periods occur. Note the date your period starts, how long it lasts, and any problems. Contact a health care provider if: Your symptoms do not get better with treatment. This information is not intended to replace advice given to you by your health care provider. Make sure you discuss any questions you have with your health care provider. Document Released: 07/19/2006 Document Revised: 11/13/2015 Document Reviewed: 11/23/2012 Elsevier Interactive Patient Education  2018 [employer].   IF you received an x-ray today, you will receive an invoice from [facility]. Please contact [facility] at [phone] with questions or concerns regarding your invoice.  IF you received labwork today, you will receive an invoice from [facility]. Please contact [facility] at [phone] with questions or concerns regarding your invoice.   Our billing staff will not be able to assist you with questions regarding bills  from these companies.  You will be contacted with the lab results as soon as they are available. The fastest way to get your results is to activate your My Chart account. Instructions are located on the last page of this paperwork. If you have not heard from [place] regarding the results in 2 weeks, please contact this office.

## 2017-07-02 LAB — CMP14+EGFR
ALT: 22 IU/L (ref 0–32)
AST: 27 IU/L (ref 0–40)
Albumin/Globulin Ratio: 1.6 (ref 1.2–2.2)
Albumin: 4.4 g/dL (ref 3.5–5.5)
Alkaline Phosphatase: 56 IU/L (ref 39–117)
BUN/Creatinine Ratio: 36 — ABNORMAL HIGH (ref 9–23)
BUN: 27 mg/dL — ABNORMAL HIGH (ref 6–20)
Bilirubin Total: 0.2 mg/dL (ref 0.0–1.2)
CALCIUM: 9.5 mg/dL (ref 8.7–10.2)
CO2: 24 mmol/L (ref 20–29)
CREATININE: 0.76 mg/dL (ref 0.57–1.00)
Chloride: 103 mmol/L (ref 96–106)
GFR calc Af Amer: 121 mL/min/{1.73_m2} (ref >59)
GFR, EST NON AFRICAN AMERICAN: 105 mL/min/{1.73_m2} (ref >59)
Globulin, Total: 2.7 g/dL (ref 1.5–4.5)
Glucose: 75 mg/dL (ref 65–99)
POTASSIUM: 4.8 mmol/L (ref 3.5–5.2)
SODIUM: 140 mmol/L (ref 134–144)
Total Protein: 7.1 g/dL (ref 6.0–8.5)

## 2017-07-02 LAB — VITAMIN D 25 HYDROXY (VIT D DEFICIENCY, FRACTURES): VIT D 25 HYDROXY: 22 ng/mL — AB (ref 30.0–100.0)

## 2017-07-02 LAB — CBC WITH DIFFERENTIAL/PLATELET
BASOS ABS: 0.1 10*3/uL (ref 0.0–0.2)
Basos: 1 %
EOS (ABSOLUTE): 0.1 10*3/uL (ref 0.0–0.4)
Eos: 3 %
Hematocrit: 40.4 % (ref 34.0–46.6)
Hemoglobin: 13.3 g/dL (ref 11.1–15.9)
IMMATURE GRANULOCYTES: 0 %
Immature Grans (Abs): 0 10*3/uL (ref 0.0–0.1)
LYMPHS ABS: 2 10*3/uL (ref 0.7–3.1)
Lymphs: 44 %
MCH: 29.4 pg (ref 26.6–33.0)
MCHC: 32.9 g/dL (ref 31.5–35.7)
MCV: 89 fL (ref 79–97)
MONOS ABS: 0.4 10*3/uL (ref 0.1–0.9)
Monocytes: 8 %
NEUTROS PCT: 44 %
Neutrophils Absolute: 2 10*3/uL (ref 1.4–7.0)
PLATELETS: 342 10*3/uL (ref 150–379)
RBC: 4.53 x10E6/uL (ref 3.77–5.28)
RDW: 14.2 % (ref 12.3–15.4)
WBC: 4.6 10*3/uL (ref 3.4–10.8)

## 2017-07-02 LAB — FSH/LH
FSH: 7.6 m[IU]/mL
LH: 2.9 m[IU]/mL

## 2017-07-02 LAB — ESTRADIOL: Estradiol: 15.7 pg/mL

## 2017-07-02 LAB — TSH: TSH: 1.04 u[IU]/mL (ref 0.450–4.500)

## 2017-07-02 LAB — PROLACTIN: PROLACTIN: 7.3 ng/mL (ref 4.8–23.3)

## 2017-07-03 ENCOUNTER — Encounter: Payer: Self-pay | Admitting: Physician Assistant

## 2017-07-05 NOTE — Addendum Note (Signed)
Addended by: Gari Crown D on: 07/05/2017 09:05 AM   Modules accepted: Orders

## 2017-07-06 LAB — TESTOSTERONE: Testosterone: <3 ng/dL — ABNORMAL LOW (ref 8–48)

## 2017-07-13 ENCOUNTER — Encounter: Payer: Self-pay | Admitting: Physician Assistant

## 2018-06-28 ENCOUNTER — Other Ambulatory Visit: Payer: Self-pay | Admitting: Chiropractic Medicine

## 2018-06-28 DIAGNOSIS — M25311 Other instability, right shoulder: Secondary | ICD-10-CM

## 2018-06-28 DIAGNOSIS — G8929 Other chronic pain: Secondary | ICD-10-CM

## 2018-06-28 DIAGNOSIS — M25511 Pain in right shoulder: Principal | ICD-10-CM

## 2018-07-08 ENCOUNTER — Ambulatory Visit
Admission: RE | Admit: 2018-07-08 | Discharge: 2018-07-08 | Disposition: A | Discharge status: Home / Self Care | Payer: Self-pay | Source: Ambulatory Visit | Attending: Chiropractic Medicine | Admitting: Chiropractic Medicine

## 2018-07-08 DIAGNOSIS — M25311 Other instability, right shoulder: Secondary | ICD-10-CM

## 2018-07-08 DIAGNOSIS — M25511 Pain in right shoulder: Principal | ICD-10-CM

## 2018-07-08 DIAGNOSIS — G8929 Other chronic pain: Secondary | ICD-10-CM

## 2019-05-08 IMAGING — DX DG ABDOMEN 2V
2 series · 2 of 2 positions shown · non-contrast
Comparison: 07/13/2011

CLINICAL DATA: Right upper quadrant pain for 2 weeks.

EXAM:
ABDOMEN - 2 VIEW

[abdomen erect]
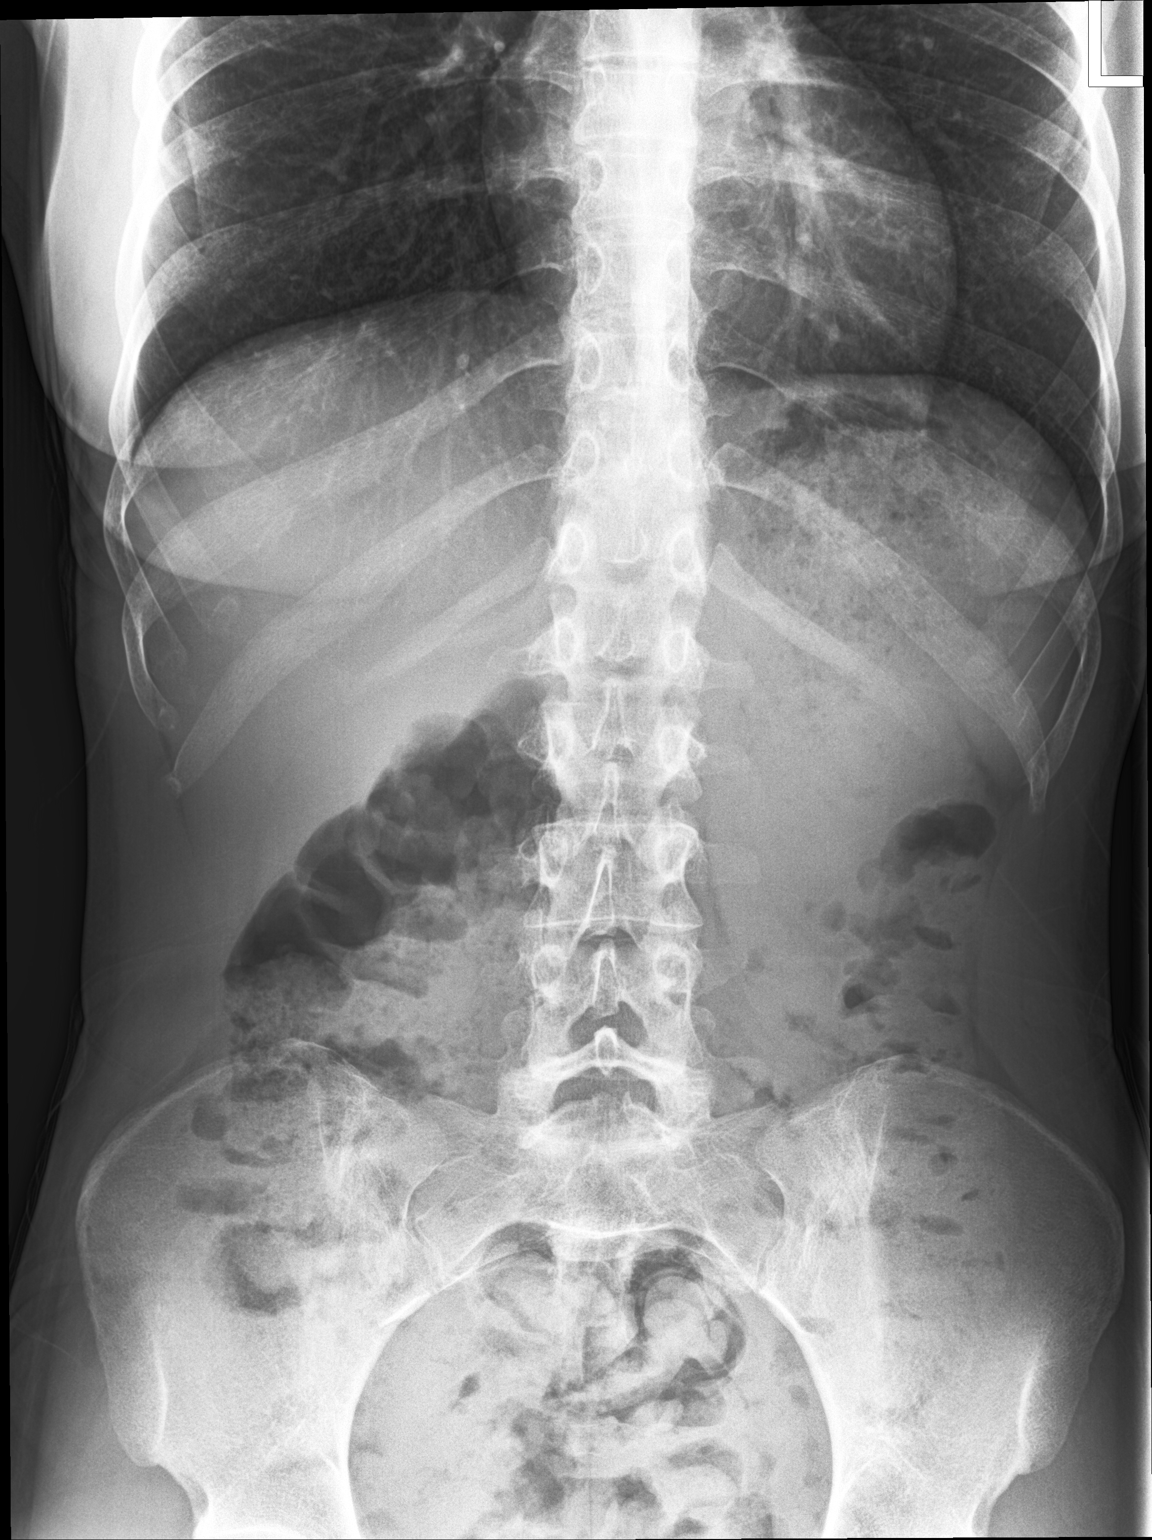

[abdomen supine]
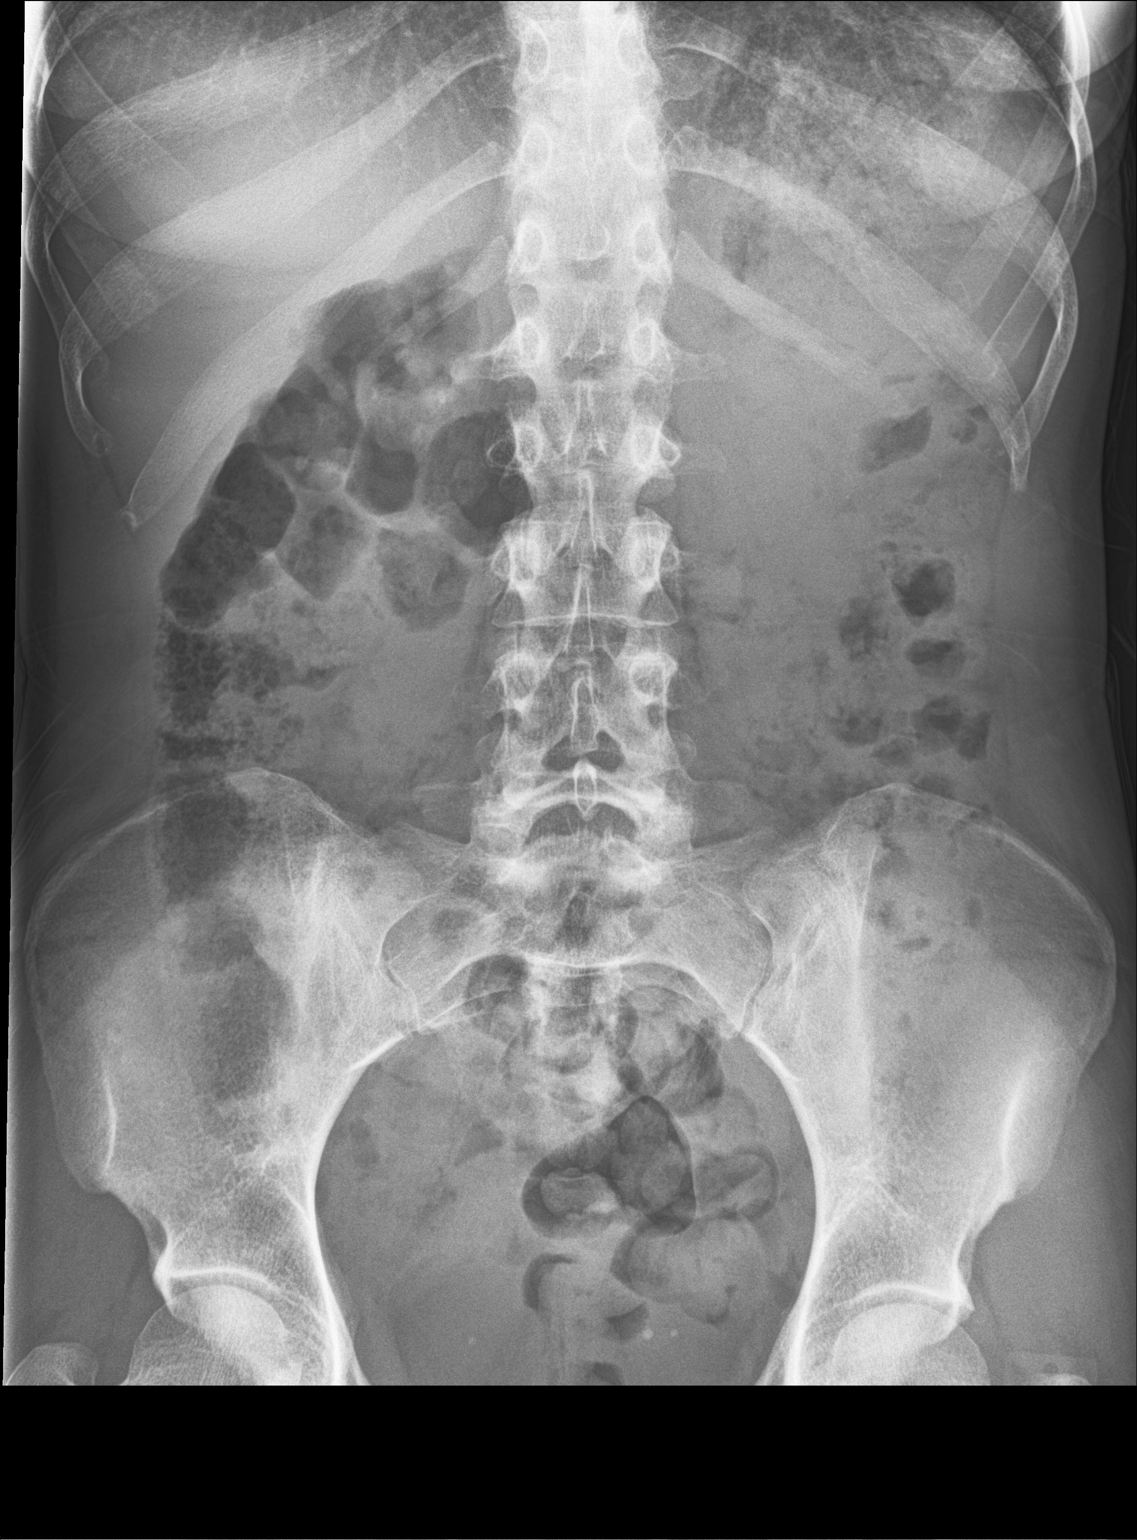

[2 of 2 positions shown; findings below may reference images not displayed]

FINDINGS: The bowel gas pattern is normal. There is no evidence of free air.
No radio-opaque calculi. Moderate to large colonic stool burden
noted.
IMPRESSION: No acute findings.  Moderate to large stool burden.

## 2019-08-19 IMAGING — DX DG FINGER LITTLE 2+V*L*
3 series · 3 of 3 positions shown · non-contrast
Comparison: None.

CLINICAL DATA: Finger injury 1 day ago. Swelling, bruising, and
tenderness involving the distal fifth metacarpal and proximal small
finger.

EXAM:
LEFT LITTLE FINGER 2+V

[finger ap]
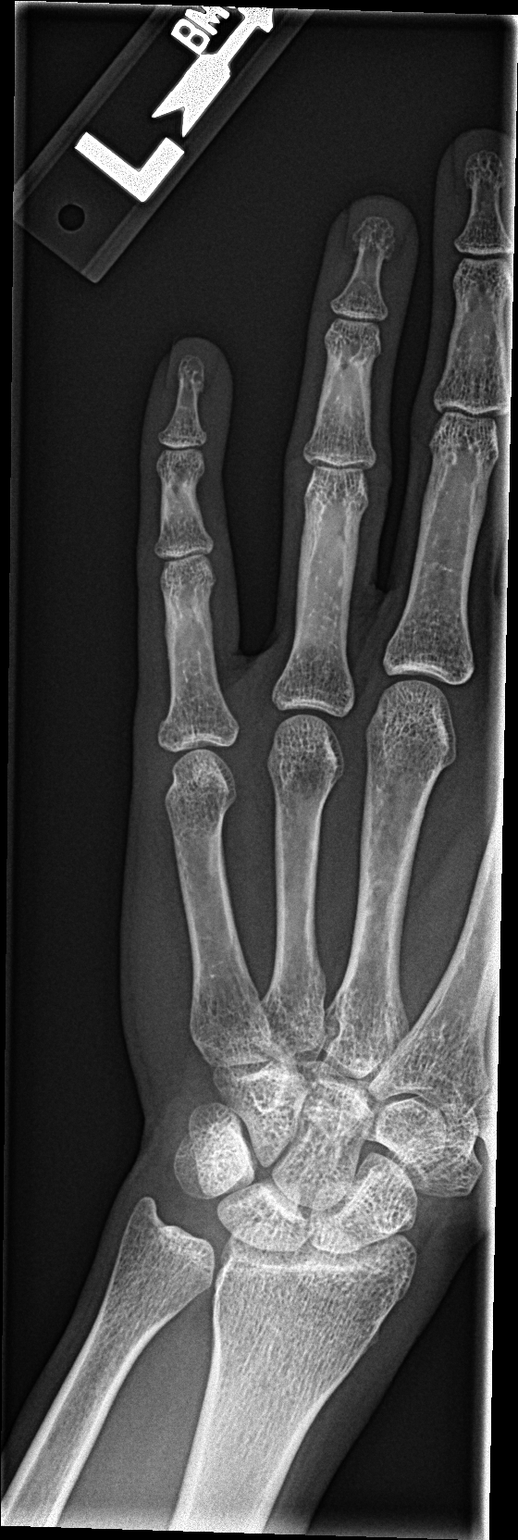

[finger obl]
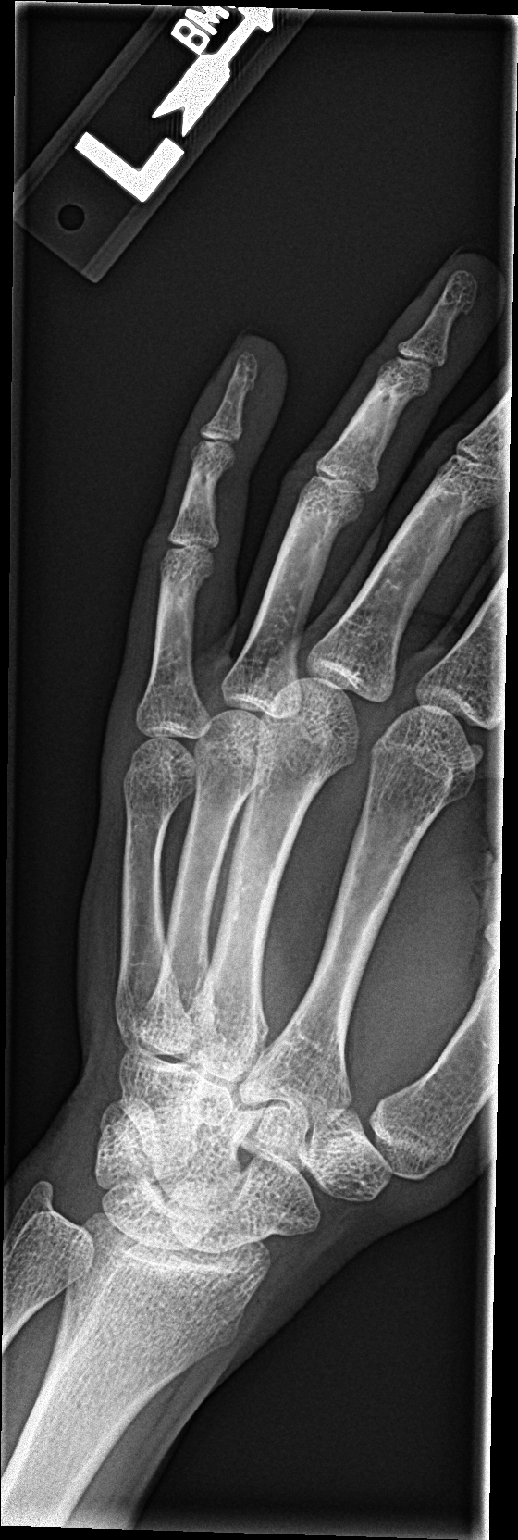

[finger lat]
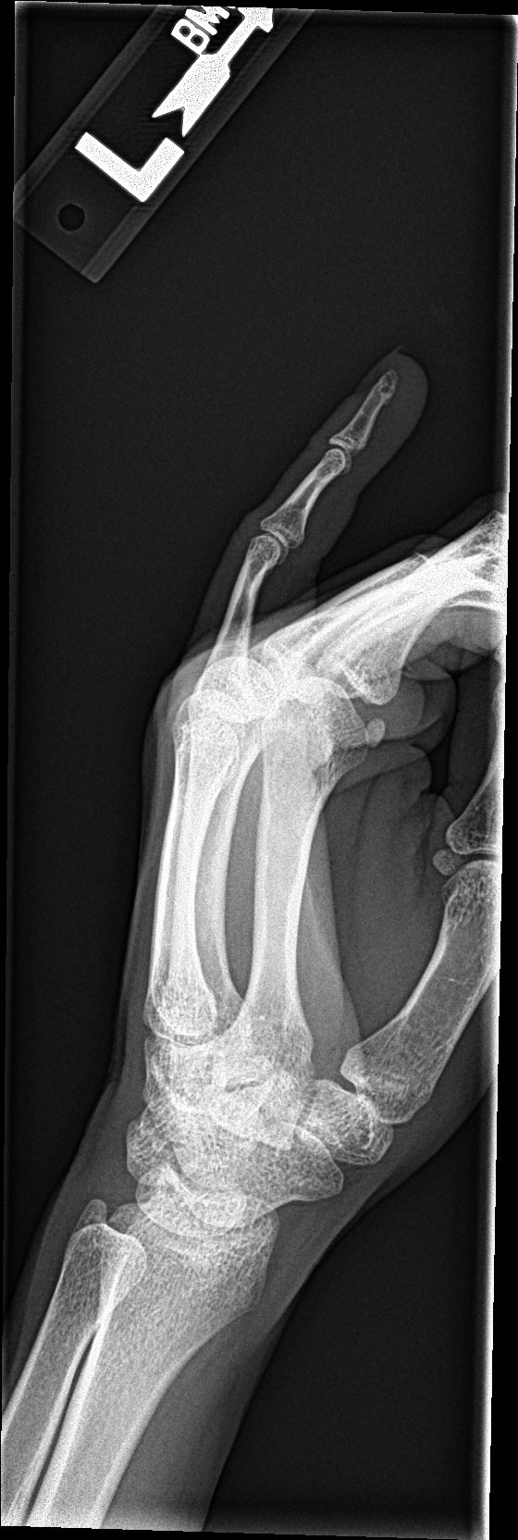

[3 of 3 positions shown; findings below may reference images not displayed]

FINDINGS: There is slight cortical irregularity involving the ulnar aspect of
the base of the proximal phalanx of the small finger on the AP
radiograph without a displaced fracture identified. A nondisplaced
fracture through the base of the proximal phalanx is questioned on
the oblique radiograph. Superimposition of multiple metacarpals
limits assessment of this region on the lateral radiograph. There is
no other evidence of fracture. There is no dislocation. There is
likely mild soft tissue swelling at the level of the fifth MCP
joint. Joint space widths are preserved.
IMPRESSION: Slight cortical irregularity of the base of the small finger
proximal phalanx for which a nondisplaced fracture is not excluded.

## 2020-05-04 ENCOUNTER — Ambulatory Visit (HOSPITAL_COMMUNITY)
Admission: EM | Admit: 2020-05-04 | Discharge: 2020-05-04 | Disposition: A | Discharge status: Home / Self Care | Payer: 59 | Attending: Urgent Care | Admitting: Urgent Care

## 2020-05-04 ENCOUNTER — Other Ambulatory Visit: Payer: Self-pay

## 2020-05-04 ENCOUNTER — Encounter (HOSPITAL_COMMUNITY): Payer: Self-pay

## 2020-05-04 DIAGNOSIS — J029 Acute pharyngitis, unspecified: Secondary | ICD-10-CM | POA: Insufficient documentation

## 2020-05-04 LAB — POCT RAPID STREP A, ED / UC: Streptococcus, Group A Screen (Direct): NEGATIVE

## 2020-05-04 NOTE — ED Provider Notes (Signed)
[place]   MRN: [id number] DOB: September 06, 1985  Subjective:   Jaclyn Lucero is a 34 y.o. female presenting for 1 day history of throat pain.  She became concerned after she noticed some white spots on her throat.  Denies review of systems as below.  No current facility-administered medications for this encounter.  Current Outpatient Medications:  .  aspirin 81 MG tablet, 3 to 4 tablets daily as needed, Disp: , Rfl:  .  NON FORMULARY, MHP XPEL (Herbal Diuertic)  4 tablets twice a day, Disp: , Rfl:  .  norethindrone-ethinyl estradiol (JUNEL FE 1/20) 1-20 MG-MCG tablet, Take 1 tablet by mouth daily., Disp: 3 Package, Rfl: 1   No Known Allergies  Past Medical History:  Diagnosis Date  . Anxiety      History reviewed. No pertinent surgical history.  Family History  Problem Relation Age of Onset  . Hypertension Maternal Grandmother     Social History   Tobacco Use  . Smoking status: Never Smoker  . Smokeless tobacco: Never Used  Vaping Use  . Vaping Use: Never used  Substance Use Topics  . Alcohol use: Yes    Comment: BEER AND SOMETIMES LIQUOR  . Drug use: No    Review of Systems  Constitutional: Negative for fever and malaise/fatigue.  HENT: Positive for sore throat. Negative for congestion, ear pain and sinus pain.   Eyes: Negative for discharge and redness.  Respiratory: Negative for cough, hemoptysis, shortness of breath and wheezing.   Cardiovascular: Negative for chest pain.  Genitourinary: Negative for dysuria, flank pain and hematuria.  Musculoskeletal: Negative for myalgias.  Skin: Negative for rash.  Neurological: Negative for dizziness, weakness and headaches.     Objective:   Vitals: BP 116/63 (BP Location: Right Arm)   Pulse (!) 55   Temp 97.7 F (36.5 C) (Oral)   Resp 16   LMP 04/14/2020   SpO2 100%   Physical Exam Constitutional:      General: She is not in acute distress.    Appearance: Normal appearance. She is  well-developed. She is not ill-appearing, toxic-appearing or diaphoretic.  HENT:     Head: Normocephalic and atraumatic.     Nose: Nose normal.     Mouth/Throat:     Mouth: Mucous membranes are moist.     Pharynx: No pharyngeal swelling, oropharyngeal exudate, posterior oropharyngeal erythema or uvula swelling.     Comments: Mild streaks of postnasal drainage overlying pharynx. Eyes:     General: No scleral icterus.    Extraocular Movements: Extraocular movements intact.     Pupils: Pupils are equal, round, and reactive to light.  Cardiovascular:     Rate and Rhythm: Normal rate.  Pulmonary:     Effort: Pulmonary effort is normal.  Skin:    General: Skin is warm and dry.  Neurological:     General: No focal deficit present.     Mental Status: She is alert and oriented to person, place, and time.  Psychiatric:        Mood and Affect: Mood normal.        Behavior: Behavior normal.     Results for orders placed or performed during the hospital encounter of 05/04/20 (from the past 24 hour(s))  POCT Rapid Strep A     Status: None   Collection Time: 05/04/20  5:35 PM  Result Value Ref Range   Streptococcus, Group A Screen (Direct) NEGATIVE NEGATIVE    Assessment and Plan :  PDMP not reviewed this encounter.  1. Pharyngitis, unspecified etiology   2. Sore throat     Patient declined COVID-19 test.  Recommended managing with supportive care, strep culture pending.  Discussed differential for pharyngitis and recommended patient recheck if her symptoms persist over the next 7 to 10 days. Counseled patient on potential for adverse effects with medications prescribed/recommended today, ER and return-to-clinic precautions discussed, patient verbalized understanding.    Jaynee Eagles, PA-C 05/04/20 1759

## 2020-05-04 NOTE — ED Triage Notes (Signed)
Pt present sore throat, symptoms started yesterday. Pt states she noticed some white patches on her throat today.

## 2020-05-04 NOTE — Discharge Instructions (Signed)
We will manage this as a viral syndrome. For sore throat or cough try using a honey-based tea. Use 3 teaspoons of honey with juice squeezed from half lemon. Place shaved pieces of ginger into 1/2-1 cup of water and warm over stove top. Then mix the ingredients and repeat every 4 hours as needed. Please take ibuprofen 400mg -600mg  every 6 hours with food alternating with OR taken together with Tylenol 500mg -650mg  every 6 hours for fevers, aches and pains. Hydrate very well with at least 2 liters of water. Eat light meals such as soups (chicken and noodles, vegetable, chicken and wild rice).  Do not eat foods that you are allergic to.  Start an antihistamine like Zyrtec, Allegra or Claritin for postnasal drainage, sinus congestion.  You can take this together with pseudoephedrine (Sudafed) at a dose of 60 mg 3 times a day or twice daily as needed for the same kind of congestion.  However, limit your use of pseudoephedrine if you have high blood pressure or avoid altogether if you have abnormal heart rhythms, heart condition.

## 2020-05-05 LAB — CULTURE, GROUP A STREP (THRC)

## 2020-05-06 ENCOUNTER — Telehealth (HOSPITAL_COMMUNITY): Payer: Self-pay | Admitting: Emergency Medicine

## 2020-05-06 LAB — CULTURE, GROUP A STREP (THRC)

## 2020-05-06 MED ORDER — PENICILLIN V POTASSIUM 500 MG PO TABS: 500.0000 mg | ORAL_TABLET | Freq: Two times a day (BID) | ORAL | 0 refills | Status: AC

## 2020-05-18 ENCOUNTER — Ambulatory Visit (HOSPITAL_COMMUNITY)
Admission: EM | Admit: 2020-05-18 | Discharge: 2020-05-18 | Disposition: A | Discharge status: Home / Self Care | Payer: 59 | Attending: Emergency Medicine | Admitting: Emergency Medicine

## 2020-05-18 ENCOUNTER — Encounter (HOSPITAL_COMMUNITY): Payer: Self-pay

## 2020-05-18 ENCOUNTER — Other Ambulatory Visit: Payer: Self-pay

## 2020-05-18 DIAGNOSIS — N760 Acute vaginitis: Secondary | ICD-10-CM | POA: Insufficient documentation

## 2020-05-18 DIAGNOSIS — Z7982 Long term (current) use of aspirin: Secondary | ICD-10-CM | POA: Insufficient documentation

## 2020-05-18 DIAGNOSIS — N898 Other specified noninflammatory disorders of vagina: Secondary | ICD-10-CM | POA: Diagnosis present

## 2020-05-18 DIAGNOSIS — Z113 Encounter for screening for infections with a predominantly sexual mode of transmission: Secondary | ICD-10-CM | POA: Diagnosis not present

## 2020-05-18 MED ORDER — FLUCONAZOLE 150 MG PO TABS: 150.0000 mg | ORAL_TABLET | Freq: Once | ORAL | 0 refills | Status: AC

## 2020-05-18 NOTE — Discharge Instructions (Signed)
Take 1 tab of diflucan- may repeat in 72 hours if still having symptoms  We are testing you for Gonorrhea, Chlamydia, Trichomonas, Yeast and Bacterial Vaginosis. We will call you if anything is positive and let you know if you require any further treatment. Please inform partners of any positive results.   Please return if symptoms not improving with treatment, development of fever, nausea, vomiting, abdominal pain.

## 2020-05-18 NOTE — ED Provider Notes (Signed)
[facility]    CSN: [id number] Arrival date & time: 05/18/20  1523      History   Chief Complaint Chief Complaint  Patient presents with  . Vaginal Discharge    HPI Jaclyn Lucero is a 34 y.o. female presenting today for evaluation of vaginal irritation.  Symptoms began approximately 2 days ago, unsure about discharge.  Last menstrual cycle began 05/15/2020.  Reports associated itching and has noticed external area red and inflamed.  Does report recent separation and has had new partner recently.  Reports history of both yeast and BV.  Denies urinary symptoms.  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  HPI  Past Medical History:  Diagnosis Date  . Anxiety     Patient Active Problem List   Diagnosis Date Noted  . Chest pain 02/11/2016  . Pain, foot, right, chronic 04/21/2012  . History of anxiety 03/19/2012  . Health care maintenance 03/19/2012  . History of abnormal Pap smear 03/19/2012  . History of migraine headaches 03/19/2012    History reviewed. No pertinent surgical history.  OB History   No obstetric history on file.      Home Medications    Prior to Admission medications   Medication Sig Start Date End Date Taking? Authorizing Provider  aspirin 81 MG tablet 3 to 4 tablets daily as needed    [provider]  fluconazole (DIFLUCAN) 150 MG tablet Take 1 tablet (150 mg total) by mouth once for 1 dose. 05/18/20 05/18/20  Geddy Boydstun C, PA-C  NON FORMULARY MHP XPEL (Herbal Diuertic)  4 tablets twice a day    [provider]  norethindrone-ethinyl estradiol (JUNEL FE 1/20) 1-20 MG-MCG tablet Take 1 tablet by mouth daily. 07/01/17   Leonie Douglas, PA-C    Family History Family History  Problem Relation Age of Onset  . Hypertension Maternal Grandmother     Social History Social History   Tobacco Use  . Smoking status: Never Smoker  . Smokeless tobacco: Never Used   Vaping Use  . Vaping Use: Never used  Substance Use Topics  . Alcohol use: Yes    Comment: BEER AND SOMETIMES LIQUOR  . Drug use: No     Allergies   Patient has no known allergies.   Review of Systems Review of Systems  Constitutional: Negative for fever.  Respiratory: Negative for shortness of breath.   Cardiovascular: Negative for chest pain.  Gastrointestinal: Negative for abdominal pain, diarrhea, nausea and vomiting.  Genitourinary: Positive for vaginal discharge. Negative for dysuria, flank pain, genital sores, hematuria, menstrual problem, vaginal bleeding and vaginal pain.  Musculoskeletal: Negative for back pain.  Skin: Negative for rash.  Neurological: Negative for dizziness, light-headedness and headaches.     Physical Exam Triage Vital Signs ED Triage Vitals  Enc Vitals Group     BP 05/18/20 1616 116/74     Pulse Rate 05/18/20 1616 68     Resp 05/18/20 1616 16     Temp 05/18/20 1616 98.9 F (37.2 C)     Temp Source 05/18/20 1616 Temporal     SpO2 05/18/20 1616 98 %     Weight --      Height --      Head Circumference --      Peak Flow --      Pain Score 05/18/20 1617 0     Pain Loc --      Pain Edu? --  Excl. in GC? --    No data found.  Updated Vital Signs BP 116/74 (BP Location: Right Arm)   Pulse 68   Temp 98.9 F (37.2 C) (Temporal)   Resp 16   LMP 05/15/2020   SpO2 98%   Visual Acuity Right Eye Distance:   Left Eye Distance:   Bilateral Distance:    Right Eye Near:   Left Eye Near:    Bilateral Near:     Physical Exam Vitals and nursing note reviewed.  Constitutional:      Appearance: She is well-developed.     Comments: No acute distress  HENT:     Head: Normocephalic and atraumatic.     Nose: Nose normal.  Eyes:     Conjunctiva/sclera: Conjunctivae normal.  Cardiovascular:     Rate and Rhythm: Normal rate.  Pulmonary:     Effort: Pulmonary effort is normal. No respiratory distress.  Abdominal:     General: There  is no distension.  Genitourinary:    Comments: Normal external female genitalia, no rashes lesions or ulcers noted externally, slight erythema, vagina with combination of white thicker discharge and a thinner slightly brownish discharge, no cervical erythema Musculoskeletal:        General: Normal range of motion.     Cervical back: Neck supple.  Skin:    General: Skin is warm and dry.  Neurological:     Mental Status: She is alert and oriented to person, place, and time.      UC Treatments / Results  Labs (all labs ordered are listed, but only abnormal results are displayed) Labs Reviewed  CERVICOVAGINAL ANCILLARY ONLY    EKG   Radiology No results found.  Procedures Procedures (including critical care time)  Medications Ordered in UC Medications - No data to display  Initial Impression / Assessment and Plan / UC Course  I have reviewed the triage vital signs and the nursing notes.  Pertinent labs & imaging results that were available during my care of the patient were reviewed by me and considered in my medical decision making (see chart for details).     No lesions externally suggestive of HSV, suspect most likely vaginitis caused by yeast.  Empirically treating for yeast with Diflucan.  Vaginal swab pending for confirmation.  Will call with results and provide further treatment as needed.  Discussed strict return precautions. Patient verbalized understanding and is agreeable with plan.  Final Clinical Impressions(s) / UC Diagnoses   Final diagnoses:  Vaginitis and vulvovaginitis     Discharge Instructions     Take 1 tab of diflucan- may repeat in 72 hours if still having symptoms  We are testing you for Gonorrhea, Chlamydia, Trichomonas, Yeast and Bacterial Vaginosis. We will call you if anything is positive and let you know if you require any further treatment. Please inform partners of any positive results.   Please return if symptoms not improving with  treatment, development of fever, nausea, vomiting, abdominal pain.     ED Prescriptions    Medication Sig Dispense Auth. Provider   fluconazole (DIFLUCAN) 150 MG tablet Take 1 tablet (150 mg total) by mouth once for 1 dose. 2 tablet Zareya Tuckett, Sharon C, PA-C     PDMP not reviewed this encounter.   Janith Lima, [place] 05/18/20 1722

## 2020-05-18 NOTE — ED Triage Notes (Signed)
Pt present vaginal irritation, symptoms started two days ago. Pt states unsure if she having discharge but it feels very irritated

## 2020-05-19 LAB — CERVICOVAGINAL ANCILLARY ONLY
Bacterial Vaginitis (gardnerella): NEGATIVE
Candida Glabrata: NEGATIVE
Candida Vaginitis: POSITIVE — AB
Chlamydia: NEGATIVE
Comment: NEGATIVE
Comment: NEGATIVE
Comment: NEGATIVE
Comment: NEGATIVE
Comment: NEGATIVE
Comment: NORMAL
Neisseria Gonorrhea: NEGATIVE
Trichomonas: NEGATIVE

## 2020-06-10 ENCOUNTER — Ambulatory Visit (HOSPITAL_COMMUNITY): Payer: Self-pay

## 2020-06-11 ENCOUNTER — Ambulatory Visit (HOSPITAL_COMMUNITY)
Admission: RE | Admit: 2020-06-11 | Discharge: 2020-06-11 | Disposition: A | Discharge status: Home / Self Care | Payer: 59 | Source: Ambulatory Visit | Attending: Family Medicine | Admitting: Family Medicine

## 2020-06-11 ENCOUNTER — Encounter (HOSPITAL_COMMUNITY): Payer: Self-pay

## 2020-06-11 ENCOUNTER — Other Ambulatory Visit: Payer: Self-pay

## 2020-06-11 VITALS — BP 114/57 | HR 70 | Temp 97.8°F | Resp 17

## 2020-06-11 DIAGNOSIS — N898 Other specified noninflammatory disorders of vagina: Secondary | ICD-10-CM | POA: Diagnosis present

## 2020-06-11 DIAGNOSIS — Z8619 Personal history of other infectious and parasitic diseases: Secondary | ICD-10-CM | POA: Insufficient documentation

## 2020-06-11 MED ORDER — FLUCONAZOLE 150 MG PO TABS: ORAL_TABLET | ORAL | 0 refills | Status: DC

## 2020-06-11 MED ORDER — TERCONAZOLE 0.8 % VA CREA: 1.0000 | TOPICAL_CREAM | Freq: Every day | VAGINAL | 0 refills | Status: AC

## 2020-06-11 MED ORDER — TRIAMCINOLONE ACETONIDE 0.1 % EX CREA: 1.0000 "application " | TOPICAL_CREAM | Freq: Two times a day (BID) | CUTANEOUS | 0 refills | Status: AC

## 2020-06-11 NOTE — ED Triage Notes (Signed)
Pt presents with vaginal irritation that has been occurring x 1 month. Pt states she has an itching and burning sensation but has not noticed discharge or bumps on vaginal area. Pt denies problems uriniating.

## 2020-06-12 LAB — CERVICOVAGINAL ANCILLARY ONLY
Bacterial Vaginitis (gardnerella): NEGATIVE
Candida Glabrata: NEGATIVE
Candida Vaginitis: POSITIVE — AB
Chlamydia: NEGATIVE
Comment: NEGATIVE
Comment: NEGATIVE
Comment: NEGATIVE
Comment: NEGATIVE
Comment: NEGATIVE
Comment: NORMAL
Neisseria Gonorrhea: NEGATIVE
Trichomonas: NEGATIVE

## 2020-06-12 NOTE — ED Provider Notes (Signed)
  [facility]   [id number] 06/11/20 Arrival Time: 9485  ASSESSMENT & PLAN:  1. Vaginal irritation     Reassured. No signs of genital herpes. Vaginal cytology sent. H/O yeast infections with vaginal irritation. Will try Diflucan first then proceed with terconazole. If she does not see improvement will f/u with her gynecologist. Sparing use of triamcinolone BID for no more than one week. Discussed.  Without s/s of PID.  Pending:  CERVICOVAGINAL ANCILLARY ONLY    Reviewed expectations re: course of current medical issues. Questions answered. Outlined signs and symptoms indicating need for more acute intervention. Patient verbalized understanding. After Visit Summary given.   SUBJECTIVE:  Jaclyn Lucero is a 34 y.o. female who presents with complaint of vaginal irritation. H/O yeast infections with similar. Current symptoms with gradual onset over past few weeks. No skin lesions. Sexually active with one partner. Does use vaginal lubricant and sex toys at times. Questions relation. No urinary symptoms. Patient's last menstrual period was 05/11/2020 (approximate). No OTC tx.  OBJECTIVE:  Vitals:   06/11/20 1714  BP: (!) 114/57  Pulse: 70  Resp: 17  Temp: 97.8 F (36.6 C)  TempSrc: Oral  SpO2: 100%     General appearance: alert, cooperative, appears stated age and no distress Lungs: unlabored respirations; speaks full sentences without difficulty Back: no CVA tenderness; FROM at waist Abdomen: soft, non-tender GU: normal appearing genitalia; bilateral labial erythema/irritation; no vaginal discharge; no rashes or lesions noted (nurse chaperone present during exam) Skin: warm and dry Psychological: alert and cooperative; normal mood and affect.   Labs Reviewed  CERVICOVAGINAL ANCILLARY ONLY    No Known Allergies  Past Medical History:  Diagnosis Date  . Anxiety    Family History  Problem Relation Age of Onset  . Hypertension Maternal Grandmother     Social History   Socioeconomic History  . Marital status: Married    Spouse name: Saralyn Pilar  . Number of children: Not on file  . Years of education: Not on file  . Highest education level: Not on file  Occupational History  . Not on file  Tobacco Use  . Smoking status: Never Smoker  . Smokeless tobacco: Never Used  Vaping Use  . Vaping Use: Never used  Substance and Sexual Activity  . Alcohol use: Yes    Comment: BEER AND SOMETIMES LIQUOR  . Drug use: No  . Sexual activity: Yes  Other Topics Concern  . Not on file  Social History Narrative  . Not on file   Social Determinants of Health   Financial Resource Strain: Not on file  Food Insecurity: Not on file  Transportation Needs: Not on file  Physical Activity: Not on file  Stress: Not on file  Social Connections: Not on file  Intimate Partner Violence: Not on file          Vanessa Kick, MD 06/12/20 1026

## 2020-10-13 ENCOUNTER — Ambulatory Visit (HOSPITAL_COMMUNITY)
Admission: RE | Admit: 2020-10-13 | Discharge: 2020-10-13 | Disposition: A | Discharge status: Home / Self Care | Payer: 59 | Source: Ambulatory Visit | Attending: Emergency Medicine | Admitting: Emergency Medicine

## 2020-10-13 ENCOUNTER — Other Ambulatory Visit: Payer: Self-pay

## 2020-10-13 ENCOUNTER — Encounter (HOSPITAL_COMMUNITY): Payer: Self-pay

## 2020-10-13 VITALS — BP 124/91 | HR 91 | Temp 98.3°F | Resp 18

## 2020-10-13 DIAGNOSIS — B373 Candidiasis of vulva and vagina: Secondary | ICD-10-CM | POA: Diagnosis not present

## 2020-10-13 DIAGNOSIS — Z113 Encounter for screening for infections with a predominantly sexual mode of transmission: Secondary | ICD-10-CM | POA: Diagnosis not present

## 2020-10-13 DIAGNOSIS — Z3202 Encounter for pregnancy test, result negative: Secondary | ICD-10-CM | POA: Diagnosis not present

## 2020-10-13 DIAGNOSIS — Z7982 Long term (current) use of aspirin: Secondary | ICD-10-CM | POA: Diagnosis not present

## 2020-10-13 DIAGNOSIS — N760 Acute vaginitis: Secondary | ICD-10-CM

## 2020-10-13 DIAGNOSIS — N898 Other specified noninflammatory disorders of vagina: Secondary | ICD-10-CM | POA: Diagnosis present

## 2020-10-13 DIAGNOSIS — B3731 Acute candidiasis of vulva and vagina: Secondary | ICD-10-CM

## 2020-10-13 LAB — POCT URINALYSIS DIPSTICK, ED / UC
Bilirubin Urine: NEGATIVE
Glucose, UA: NEGATIVE mg/dL
Hgb urine dipstick: NEGATIVE
Ketones, ur: NEGATIVE mg/dL
Leukocytes,Ua: NEGATIVE
Nitrite: NEGATIVE
Protein, ur: NEGATIVE mg/dL
Specific Gravity, Urine: <=1.005 (ref 1.005–1.030)
Urobilinogen, UA: 0.2 mg/dL (ref 0.0–1.0)
pH: 6.5 (ref 5.0–8.0)

## 2020-10-13 LAB — POC URINE PREG, ED: Preg Test, Ur: NEGATIVE

## 2020-10-13 LAB — HIV ANTIBODY (ROUTINE TESTING W REFLEX): HIV Screen 4th Generation wRfx: NONREACTIVE

## 2020-10-13 MED ORDER — FLUCONAZOLE 150 MG PO TABS: ORAL_TABLET | ORAL | 0 refills | Status: AC

## 2020-10-13 NOTE — ED Provider Notes (Signed)
[facility]    CSN: [id number] Arrival date & time: 10/13/20  1659      History   Chief Complaint Chief Complaint  Patient presents with  . Vaginitis    HPI Jaclyn Lucero is a 35 y.o. female.   Jaclyn Lucero presents with complaints of vaginal discharge, itching and irritation. Feeling like "cuts" to vulva/vagina. This can be typical of yeast infections she has had recurrently. However, two nights ago she was intoxicated and woke up in the hospital, due to the intoxication, with no memory of the night, she is concerned about possible sexual contact. She has not had any pelvic pain, vaginal bleeding or any obvious indications that there was any sexual contact.     ROS per HPI, negative if not otherwise mentioned.      Past Medical History:  Diagnosis Date  . Anxiety     Patient Active Problem List   Diagnosis Date Noted  . Chest pain 02/11/2016  . Pain, foot, right, chronic 04/21/2012  . History of anxiety 03/19/2012  . Health care maintenance 03/19/2012  . History of abnormal Pap smear 03/19/2012  . History of migraine headaches 03/19/2012    History reviewed. No pertinent surgical history.  OB History   No obstetric history on file.      Home Medications    Prior to Admission medications   Medication Sig Start Date End Date Taking? Authorizing Provider  fluconazole (DIFLUCAN) 150 MG tablet Take 1 tab today. May repeat in 72 hours if symptoms have not completely resolved. 10/13/20  Yes Augusto Gamble B, NP  aspirin 81 MG tablet 3 to 4 tablets daily as needed    [provider]  NON FORMULARY MHP XPEL (Herbal Diuertic)  4 tablets twice a day    [provider]  norethindrone-ethinyl estradiol (JUNEL FE 1/20) 1-20 MG-MCG tablet Take 1 tablet by mouth daily. 07/01/17   Tenna Delaine D, PA-C  terconazole (TERAZOL 3) 0.8 % vaginal cream Place 1 applicator vaginally at bedtime. 06/11/20   Vanessa Kick, MD  triamcinolone  (KENALOG) 0.1 % Apply 1 application topically 2 (two) times daily. Use for no more than one week. 06/11/20   Vanessa Kick, MD    Family History Family History  Problem Relation Age of Onset  . Hypertension Maternal Grandmother     Social History Social History   Tobacco Use  . Smoking status: Never Smoker  . Smokeless tobacco: Never Used  Vaping Use  . Vaping Use: Never used  Substance Use Topics  . Alcohol use: Yes    Comment: BEER AND SOMETIMES LIQUOR  . Drug use: No     Allergies   Patient has no known allergies.   Review of Systems Review of Systems   Physical Exam Triage Vital Signs ED Triage Vitals  Enc Vitals Group     BP 10/13/20 1812 (!) 124/91     Pulse Rate 10/13/20 1812 91     Resp 10/13/20 1812 18     Temp 10/13/20 1812 98.3 F (36.8 C)     Temp Source 10/13/20 1812 Oral     SpO2 10/13/20 1812 100 %     Weight --      Height --      Head Circumference --      Peak Flow --      Pain Score 10/13/20 1810 2     Pain Loc --      Pain Edu? --  Excl. in GC? --    No data found.  Updated Vital Signs BP (!) 124/91 (BP Location: Left Arm)   Pulse 91   Temp 98.3 F (36.8 C) (Oral)   Resp 18   SpO2 100%   Visual Acuity Right Eye Distance:   Left Eye Distance:   Bilateral Distance:    Right Eye Near:   Left Eye Near:    Bilateral Near:     Physical Exam Constitutional:      General: She is not in acute distress.    Appearance: She is well-developed.  Cardiovascular:     Rate and Rhythm: Normal rate.  Pulmonary:     Effort: Pulmonary effort is normal.  Abdominal:     Tenderness: There is no abdominal tenderness.  Genitourinary:    Labia:        Right: No rash, lesion or injury.        Left: No rash, lesion or injury.      Vagina: Vaginal discharge present.     Comments: Small external vulvar discharge noted from vagina, white; vulvar dryness noted; no visible sores/ lesions ulcers or hematoma's; vagina with thick white  discharge without any traumatic findings Skin:    General: Skin is warm and dry.  Neurological:     Mental Status: She is alert and oriented to person, place, and time.      UC Treatments / Results  Labs (all labs ordered are listed, but only abnormal results are displayed) Labs Reviewed  HIV ANTIBODY (ROUTINE TESTING W REFLEX)  RPR  POCT URINALYSIS DIPSTICK, ED / UC  POC URINE PREG, ED  CERVICOVAGINAL ANCILLARY ONLY    EKG   Radiology No results found.  Procedures Procedures (including critical care time)  Medications Ordered in UC Medications - No data to display  Initial Impression / Assessment and Plan / UC Course  I have reviewed the triage vital signs and the nursing notes.  Pertinent labs & imaging results that were available during my care of the patient were reviewed by me and considered in my medical decision making (see chart for details).     Findings consistent with vaginal yeast infection. Baseline std screen obtained as well, with recommendations to return in 6 weeks. Take home pregnancy test in 2 weeks as she does not have periods. Has appointment with gynecology in two weeks for follow up in regards to recurrent yeast infections, as she is not diabetic. Return precautions provided. Patient verbalized understanding and agreeable to plan.   Final Clinical Impressions(s) / UC Diagnoses   Final diagnoses:  Vaginal yeast infection  Screen for STD (sexually transmitted disease)     Discharge Instructions     Diflucan for current yeast infection.  We will notify of you any positive findings or if any changes to treatment are needed. If normal or otherwise without concern to your results, we will not call you. Please log on to your MyChart to review your results if interested in so.   Your testing would not be positive for std's following exposure two nights ago.  I do recommend re-test in 6 weeks and then again in three months.  I do recommend checking  a home pregnancy test in two weeks as well since you do not have periods.  Please follow up with gynecology in regards to your yeast infections. Log your diet as some dietary factors can contribute to yeast infections as well.     ED Prescriptions    Medication  Sig Dispense Auth. Provider   fluconazole (DIFLUCAN) 150 MG tablet Take 1 tab today. May repeat in 72 hours if symptoms have not completely resolved. 2 tablet Zigmund Gottron, NP     PDMP not reviewed this encounter.   Zigmund Gottron, NP 10/13/20 2038

## 2020-10-13 NOTE — Discharge Instructions (Addendum)
Diflucan for current yeast infection.  We will notify of you any positive findings or if any changes to treatment are needed. If normal or otherwise without concern to your results, we will not call you. Please log on to your MyChart to review your results if interested in so.   Your testing would not be positive for std's following exposure two nights ago.  I do recommend re-test in 6 weeks and then again in three months.  I do recommend checking a home pregnancy test in two weeks as well since you do not have periods.  Please follow up with gynecology in regards to your yeast infections. Log your diet as some dietary factors can contribute to yeast infections as well.

## 2020-10-13 NOTE — ED Triage Notes (Signed)
Pt presents with recurrent vaginal irritation and itching.

## 2020-10-14 LAB — CERVICOVAGINAL ANCILLARY ONLY
Bacterial Vaginitis (gardnerella): NEGATIVE
Candida Glabrata: NEGATIVE
Candida Vaginitis: POSITIVE — AB
Chlamydia: NEGATIVE
Comment: NEGATIVE
Comment: NEGATIVE
Comment: NEGATIVE
Comment: NEGATIVE
Comment: NEGATIVE
Comment: NORMAL
Neisseria Gonorrhea: NEGATIVE
Trichomonas: NEGATIVE

## 2020-10-14 LAB — RPR: RPR Ser Ql: NONREACTIVE

## 2022-02-17 DIAGNOSIS — F3341 Major depressive disorder, recurrent, in partial remission: Secondary | ICD-10-CM | POA: Diagnosis not present

## 2022-02-17 DIAGNOSIS — R69 Illness, unspecified: Secondary | ICD-10-CM | POA: Diagnosis not present

## 2022-07-29 DIAGNOSIS — Z3202 Encounter for pregnancy test, result negative: Secondary | ICD-10-CM | POA: Diagnosis not present

## 2022-07-29 DIAGNOSIS — N926 Irregular menstruation, unspecified: Secondary | ICD-10-CM | POA: Diagnosis not present

## 2022-09-07 DIAGNOSIS — M25559 Pain in unspecified hip: Secondary | ICD-10-CM | POA: Diagnosis not present

## 2022-09-08 DIAGNOSIS — Z6821 Body mass index (BMI) 21.0-21.9, adult: Secondary | ICD-10-CM | POA: Diagnosis not present

## 2022-09-08 DIAGNOSIS — F411 Generalized anxiety disorder: Secondary | ICD-10-CM | POA: Diagnosis not present

## 2022-09-08 DIAGNOSIS — Z0000 Encounter for general adult medical examination without abnormal findings: Secondary | ICD-10-CM | POA: Diagnosis not present

## 2022-09-08 DIAGNOSIS — F3341 Major depressive disorder, recurrent, in partial remission: Secondary | ICD-10-CM | POA: Diagnosis not present

## 2022-09-08 DIAGNOSIS — Z79899 Other long term (current) drug therapy: Secondary | ICD-10-CM | POA: Diagnosis not present

## 2022-09-28 DIAGNOSIS — M25559 Pain in unspecified hip: Secondary | ICD-10-CM | POA: Diagnosis not present

## 2022-11-25 DIAGNOSIS — F411 Generalized anxiety disorder: Secondary | ICD-10-CM | POA: Diagnosis not present

## 2022-12-02 DIAGNOSIS — F411 Generalized anxiety disorder: Secondary | ICD-10-CM | POA: Diagnosis not present

## 2022-12-07 DIAGNOSIS — F411 Generalized anxiety disorder: Secondary | ICD-10-CM | POA: Diagnosis not present

## 2022-12-21 DIAGNOSIS — F411 Generalized anxiety disorder: Secondary | ICD-10-CM | POA: Diagnosis not present

## 2022-12-30 DIAGNOSIS — F411 Generalized anxiety disorder: Secondary | ICD-10-CM | POA: Diagnosis not present

## 2023-04-05 DIAGNOSIS — Z124 Encounter for screening for malignant neoplasm of cervix: Secondary | ICD-10-CM | POA: Diagnosis not present

## 2023-04-05 DIAGNOSIS — Z6820 Body mass index (BMI) 20.0-20.9, adult: Secondary | ICD-10-CM | POA: Diagnosis not present

## 2023-04-05 DIAGNOSIS — Z1151 Encounter for screening for human papillomavirus (HPV): Secondary | ICD-10-CM | POA: Diagnosis not present

## 2023-04-05 DIAGNOSIS — Z01419 Encounter for gynecological examination (general) (routine) without abnormal findings: Secondary | ICD-10-CM | POA: Diagnosis not present

## 2023-09-28 DIAGNOSIS — F411 Generalized anxiety disorder: Secondary | ICD-10-CM | POA: Diagnosis not present

## 2023-09-28 DIAGNOSIS — Z0000 Encounter for general adult medical examination without abnormal findings: Secondary | ICD-10-CM | POA: Diagnosis not present

## 2024-05-23 DIAGNOSIS — Z13228 Encounter for screening for other metabolic disorders: Secondary | ICD-10-CM | POA: Diagnosis not present

## 2024-05-23 DIAGNOSIS — Z1329 Encounter for screening for other suspected endocrine disorder: Secondary | ICD-10-CM | POA: Diagnosis not present

## 2024-05-23 DIAGNOSIS — Z1321 Encounter for screening for nutritional disorder: Secondary | ICD-10-CM | POA: Diagnosis not present

## 2024-05-23 DIAGNOSIS — Z1151 Encounter for screening for human papillomavirus (HPV): Secondary | ICD-10-CM | POA: Diagnosis not present

## 2024-05-23 DIAGNOSIS — Z681 Body mass index (BMI) 19 or less, adult: Secondary | ICD-10-CM | POA: Diagnosis not present

## 2024-05-23 DIAGNOSIS — Z131 Encounter for screening for diabetes mellitus: Secondary | ICD-10-CM | POA: Diagnosis not present

## 2024-05-23 DIAGNOSIS — Z13220 Encounter for screening for lipoid disorders: Secondary | ICD-10-CM | POA: Diagnosis not present

## 2024-05-23 DIAGNOSIS — Z01419 Encounter for gynecological examination (general) (routine) without abnormal findings: Secondary | ICD-10-CM | POA: Diagnosis not present

## 2024-05-23 DIAGNOSIS — Z124 Encounter for screening for malignant neoplasm of cervix: Secondary | ICD-10-CM | POA: Diagnosis not present
